# Patient Record
Sex: Male | Born: 1975
Health system: Southern US, Community
[De-identification: ages and names within clinical notes are randomized; demographics above are authoritative.]

## PROBLEM LIST (undated history)

## (undated) DIAGNOSIS — I341 Nonrheumatic mitral (valve) prolapse: Secondary | ICD-10-CM

## (undated) DIAGNOSIS — E785 Hyperlipidemia, unspecified: Secondary | ICD-10-CM

## (undated) HISTORY — DX: Hyperlipidemia, unspecified: E78.5

---

## 2007-01-25 HISTORY — PX: ANKLE FRACTURE SURGERY: SHX122

## 2012-05-10 ENCOUNTER — Emergency Department (HOSPITAL_COMMUNITY)
Admission: EM | Admit: 2012-05-10 | Discharge: 2012-05-10 | Disposition: A | Discharge status: Home / Self Care | Payer: Medicaid Other | Attending: Emergency Medicine | Admitting: Emergency Medicine

## 2012-05-10 ENCOUNTER — Encounter (HOSPITAL_COMMUNITY): Payer: Self-pay | Admitting: Emergency Medicine

## 2012-05-10 DIAGNOSIS — R300 Dysuria: Secondary | ICD-10-CM | POA: Insufficient documentation

## 2012-05-10 DIAGNOSIS — R350 Frequency of micturition: Secondary | ICD-10-CM | POA: Insufficient documentation

## 2012-05-10 DIAGNOSIS — K047 Periapical abscess without sinus: Secondary | ICD-10-CM | POA: Insufficient documentation

## 2012-05-10 DIAGNOSIS — K089 Disorder of teeth and supporting structures, unspecified: Secondary | ICD-10-CM | POA: Insufficient documentation

## 2012-05-10 DIAGNOSIS — Z8679 Personal history of other diseases of the circulatory system: Secondary | ICD-10-CM | POA: Insufficient documentation

## 2012-05-10 HISTORY — DX: Nonrheumatic mitral (valve) prolapse: I34.1

## 2012-05-10 LAB — URINE MICROSCOPIC-ADD ON

## 2012-05-10 LAB — URINALYSIS, ROUTINE W REFLEX MICROSCOPIC
Glucose, UA: NEGATIVE mg/dL
Leukocytes, UA: NEGATIVE
Protein, ur: NEGATIVE mg/dL
pH: 5.5 (ref 5.0–8.0)

## 2012-05-10 MED ORDER — PENICILLIN V POTASSIUM 500 MG PO TABS: 500.0000 mg | ORAL_TABLET | Freq: Four times a day (QID) | ORAL | Status: AC

## 2012-05-10 MED ORDER — HYDROCODONE-ACETAMINOPHEN 5-325 MG PO TABS: 2.0000 | ORAL_TABLET | ORAL | Status: DC | PRN

## 2012-05-10 NOTE — ED Provider Notes (Signed)
History     CSN: [id number]  Arrival date & time 05/10/12  1215   First MD Initiated Contact with Patient 05/10/12 1225      Chief Complaint  Patient presents with  . Dental Pain    (Consider location/radiation/quality/duration/timing/severity/associated sxs/prior treatment) HPI Comments: Patient is a 37 year old male who presents today with worsening right sided dental pain. He has previously had a root canal in this tooth. He states the pain is intermittent it is sharp and radiates up into his face. He states there is gum swelling. The pain is sharp. Eating makes the pain worse. He denies fevers, chills, nausea, vomiting, abdominal pain, trismus, weakness, numbness. He admits to urinary frequency and dysuria.  Patient is a 37 y.o. male presenting with tooth pain. The history is provided by the patient. No language interpreter was used.  Dental PainPrimary symptoms do not include headaches, fever, shortness of breath, sore throat or cough. The symptoms began 2 days ago. The symptoms are worsening. The symptoms are new. The symptoms occur intermittently.  Additional symptoms include: gum swelling, gum tenderness and purulent gums. Additional symptoms do not include: trismus, jaw pain, facial swelling, trouble swallowing, dry mouth and drooling.    Past Medical History  Diagnosis Date  . Mitral valve prolapse     History reviewed. No pertinent past surgical history.  History reviewed. No pertinent family history.  History  Substance Use Topics  . Smoking status: Never Smoker   . Smokeless tobacco: Not on file  . Alcohol Use: No      Review of Systems  Constitutional: Negative for fever and chills.  HENT: Positive for dental problem. Negative for sore throat, facial swelling, drooling and trouble swallowing.   Respiratory: Negative for cough and shortness of breath.   Neurological: Negative for headaches.  All other systems reviewed and are negative.    Allergies   Review of patient's allergies indicates no known allergies.  Home Medications  No current outpatient prescriptions on file.  BP 118/69  Pulse 66  Temp(Src) 97 F (36.1 C) (Oral)  Resp 20  SpO2 96%  Physical Exam  Nursing note and vitals reviewed. Constitutional: He is oriented to person, place, and time. He appears well-developed and well-nourished. No distress.  HENT:  Head: Normocephalic and atraumatic.  Right Ear: External ear normal.  Left Ear: External ear normal.  Nose: Nose normal.  Small 5 mm abscess visible on upper right gumline No submental edema, tongue elevation, or trismus  Eyes: Conjunctivae and lids are normal.  Neck: Normal range of motion. No tracheal deviation present.  Cardiovascular: Normal rate, regular rhythm and normal heart sounds.   Pulmonary/Chest: Effort normal and breath sounds normal. No stridor.  Abdominal: Soft. He exhibits no distension. There is no tenderness.  Musculoskeletal: Normal range of motion.  Neurological: He is alert and oriented to person, place, and time.  Skin: Skin is warm and dry. He is not diaphoretic.  Psychiatric: He has a normal mood and affect. His behavior is normal.    ED Course  Procedures (including critical care time)  Labs Reviewed  URINALYSIS, ROUTINE W REFLEX MICROSCOPIC - Abnormal; Notable for the following:    Hgb urine dipstick SMALL (*)    All other components within normal limits  URINE MICROSCOPIC-ADD ON   No results found.   1. Dental abscess       MDM  Patient presents with worsening dental pain. Visible abscess which was successfully drained without complication. No signs of impending obstruction.  Discussed salt water gargles throughout the day. Given penicillin. Referred to dentist on call. Resource guide given to encourage patient to followup with PCP.  Return instructions given. Vital signs stable for discharge. Patient / Family / Caregiver informed of clinical course, understand medical  decision-making process, and agree with plan.        Mora Bellman, PA-C 05/10/12 [phone]

## 2012-05-10 NOTE — ED Notes (Signed)
Also c/o urinary frequency and burning since last pm.

## 2012-05-10 NOTE — ED Provider Notes (Signed)
Medical screening examination/treatment/procedure(s) were performed by non-physician practitioner and as supervising physician I was immediately available for consultation/collaboration.  Raeford Razor, MD 05/10/12 [phone]

## 2012-05-10 NOTE — ED Notes (Signed)
C/O pain and swelling right upper gum. Has had "root canal" on that tooth. Pt's brother is a Education officer, community in [place]. Pt has made an appointment with a dentist for May 28th.

## 2012-05-10 NOTE — ED Notes (Signed)
Pt c/o right upper dental pain that is intermittent

## 2012-11-25 ENCOUNTER — Emergency Department (HOSPITAL_COMMUNITY)
Admission: EM | Admit: 2012-11-25 | Discharge: 2012-11-26 | Disposition: A | Discharge status: Home / Self Care | Payer: Medicaid Other | Attending: Emergency Medicine | Admitting: Emergency Medicine

## 2012-11-25 ENCOUNTER — Encounter (HOSPITAL_COMMUNITY): Payer: Self-pay | Admitting: Emergency Medicine

## 2012-11-25 ENCOUNTER — Emergency Department (HOSPITAL_COMMUNITY): Payer: Medicaid Other

## 2012-11-25 DIAGNOSIS — Y9389 Activity, other specified: Secondary | ICD-10-CM | POA: Insufficient documentation

## 2012-11-25 DIAGNOSIS — S6980XA Other specified injuries of unspecified wrist, hand and finger(s), initial encounter: Secondary | ICD-10-CM | POA: Insufficient documentation

## 2012-11-25 DIAGNOSIS — W230XXA Caught, crushed, jammed, or pinched between moving objects, initial encounter: Secondary | ICD-10-CM | POA: Insufficient documentation

## 2012-11-25 DIAGNOSIS — S60112A Contusion of left thumb with damage to nail, initial encounter: Secondary | ICD-10-CM

## 2012-11-25 DIAGNOSIS — S6000XA Contusion of unspecified finger without damage to nail, initial encounter: Secondary | ICD-10-CM | POA: Insufficient documentation

## 2012-11-25 DIAGNOSIS — S6990XA Unspecified injury of unspecified wrist, hand and finger(s), initial encounter: Secondary | ICD-10-CM | POA: Insufficient documentation

## 2012-11-25 DIAGNOSIS — M79645 Pain in left finger(s): Secondary | ICD-10-CM

## 2012-11-25 DIAGNOSIS — Y9289 Other specified places as the place of occurrence of the external cause: Secondary | ICD-10-CM | POA: Insufficient documentation

## 2012-11-25 DIAGNOSIS — Z8679 Personal history of other diseases of the circulatory system: Secondary | ICD-10-CM | POA: Insufficient documentation

## 2012-11-25 NOTE — ED Notes (Signed)
The pt is c/o pain in his lt thumb distal.  He caught it in a car door earlier tonight and there is blood collected under the nail.  The thumb is throbbing

## 2012-11-26 MED ORDER — HYDROCODONE-ACETAMINOPHEN 5-325 MG PO TABS: 1.0000 | ORAL_TABLET | Freq: Four times a day (QID) | ORAL | Status: DC | PRN

## 2012-11-26 NOTE — ED Provider Notes (Addendum)
CSN: [id number]     Arrival date & time 11/25/12  2253 History   First MD Initiated Contact with Patient 11/25/12 2331     Chief Complaint  Patient presents with  . Finger Injury   (Consider location/radiation/quality/duration/timing/severity/associated sxs/prior Treatment) HPI Comments: Patient is a 37 y/o male who presents for pain to his L distal thumb. Patient states pain is throbbing and nonradiating and has been constant since onset after getting it caught in a car door earlier. Patient has tried topical and PO pain medicine without relief of throbbing pain. Denies fever, numbness/tingling, weakness, and pallor.  The history is provided by the patient. No language interpreter was used.    Past Medical History  Diagnosis Date  . Mitral valve prolapse    History reviewed. No pertinent past surgical history. No family history on file. History  Substance Use Topics  . Smoking status: Never Smoker   . Smokeless tobacco: Not on file  . Alcohol Use: No    Review of Systems  Constitutional: Negative for fever.  Musculoskeletal: Positive for arthralgias and myalgias.  Skin: Positive for color change. Negative for pallor.  Neurological: Negative for weakness and numbness.  All other systems reviewed and are negative.    Allergies  Review of patient's allergies indicates no known allergies.  Home Medications   Current Outpatient Rx  Name  Route  Sig  Dispense  Refill  . acetaminophen (TYLENOL) 500 MG tablet   Oral   Take 1,000 mg by mouth every 6 (six) hours as needed for pain.         Marland Kitchen HYDROcodone-acetaminophen (NORCO/VICODIN) 5-325 MG per tablet   Oral   Take 1 tablet by mouth every 6 (six) hours as needed for pain.   5 tablet   0    BP 123/83  Pulse 80  Temp(Src) 97.6 F (36.4 C)  Resp 18  SpO2 98%  Physical Exam  Nursing note and vitals reviewed. Constitutional: He is oriented to person, place, and time. He appears well-developed and well-nourished. No  distress.  HENT:  Head: Normocephalic and atraumatic.  Eyes: Conjunctivae and EOM are normal. No scleral icterus.  Neck: Normal range of motion.  Cardiovascular: Normal rate, regular rhythm and intact distal pulses.   Distal radial pulse of LUE 2+. Normal capillary refill.  Pulmonary/Chest: Effort normal. No respiratory distress.  Musculoskeletal: Normal range of motion.  Normal ROM of LUE and hand. Normal grip strength and 5/5 strength against resistance of flexors and extensors of L thumb. +Subungual hematoma.  Neurological: He is alert and oriented to person, place, and time.  Skin: Skin is warm and dry. No rash noted. He is not diaphoretic. No erythema. No pallor.  Psychiatric: He has a normal mood and affect. His behavior is normal.    ED Course  Cauterization Date/Time: 11/26/2012 12:36 AM Performed by: Antony Madura Authorized by: Antony Madura Consent: Verbal consent obtained. written consent not obtained. The procedure was performed in an emergent situation. Risks and benefits: risks, benefits and alternatives were discussed Consent given by: patient Patient understanding: patient states understanding of the procedure being performed Patient consent: the patient's understanding of the procedure matches consent given Procedure consent: procedure consent matches procedure scheduled Relevant documents: relevant documents present and verified Test results: test results available and properly labeled Site marked: the operative site was marked Imaging studies: imaging studies available Required items: required blood products, implants, devices, and special equipment available Patient identity confirmed: verbally with patient and arm band Time  out: Immediately prior to procedure a "time out" was called to verify the correct patient, procedure, equipment, support staff and site/side marked as required. Preparation: Patient was prepped and draped in the usual sterile fashion. Local  anesthesia used: no Patient sedated: no Patient tolerance: Patient tolerated the procedure well with no immediate complications. Comments: Nail cauterization for subungual hematoma using a disposable nail cauterizer   (including critical care time) Labs Review Labs Reviewed - No data to display  Imaging Review Dg Finger Thumb Left  11/25/2012   CLINICAL DATA:  Injury to the left thumb.  EXAM: LEFT THUMB 2+V  COMPARISON:  No priors.  FINDINGS: Multiple views of the left from demonstrate no acute displaced fracture, subluxation, dislocation, or soft tissue abnormality. Well corticated bony fragment adjacent of the volar plate of the distal phalanx may represent sequela of remote avulsion fracture (this does not represent an acute injury).  IMPRESSION: No acute radiographic abnormality of the left thumb.   Electronically Signed   By: Trudie Reed M.D.   On: 11/25/2012 23:21    EKG Interpretation   None       MDM   1. Subungual hematoma of left thumb, initial encounter   2. Thumb pain, left    Left thumb pain secondary to subungual hematoma after getting his finger caught in a door today. No evidence of acute fracture on x-ray. Patient: Nontoxic-appearing, hemodynamically stable, and afebrile. He is neurovascularly intact with good perfusion to his left thumb. Symptoms managed in ED with nail cautery with significant improvement in pain. Patient stable and appropriate for d/c with PCP follow up to ensure proper healing. Return precautions discussed and patient agreeable to plan with no unaddressed concerns.     Antony Madura, PA-C 11/27/12 [id number]  Antony Madura, PA-C 12/04/12 2017

## 2012-11-28 NOTE — ED Provider Notes (Signed)
Medical screening examination/treatment/procedure(s) were performed by non-physician practitioner and as supervising physician I was immediately available for consultation/collaboration.  EKG Interpretation   None         William Mckennah Kretchmer, MD 11/28/12 0752 

## 2012-12-05 NOTE — ED Provider Notes (Signed)
Medical screening examination/treatment/procedure(s) were performed by non-physician practitioner and as supervising physician I was immediately available for consultation/collaboration.  EKG Interpretation   None         Dagmar Hait, MD 12/05/12 [phone]

## 2013-12-31 ENCOUNTER — Other Ambulatory Visit (HOSPITAL_COMMUNITY): Payer: Self-pay | Admitting: Internal Medicine

## 2013-12-31 DIAGNOSIS — I341 Nonrheumatic mitral (valve) prolapse: Secondary | ICD-10-CM

## 2014-01-02 ENCOUNTER — Ambulatory Visit (HOSPITAL_COMMUNITY)
Admission: RE | Admit: 2014-01-02 | Discharge: 2014-01-02 | Disposition: A | Discharge status: Home / Self Care | Payer: Medicaid Other | Source: Ambulatory Visit | Attending: Internal Medicine | Admitting: Internal Medicine

## 2014-01-02 DIAGNOSIS — I349 Nonrheumatic mitral valve disorder, unspecified: Secondary | ICD-10-CM | POA: Diagnosis present

## 2014-01-02 DIAGNOSIS — I341 Nonrheumatic mitral (valve) prolapse: Secondary | ICD-10-CM

## 2014-01-02 DIAGNOSIS — I519 Heart disease, unspecified: Secondary | ICD-10-CM

## 2014-01-02 NOTE — Progress Notes (Signed)
Echocardiogram 2D Echocardiogram has been performed.  Dorothey BasemanReel, Ninfa Giannelli M 01/02/2014, 2:14 PM

## 2015-01-03 IMAGING — CR DG FINGER THUMB 2+V*L*
3 series · 3 of 3 positions shown · non-contrast
Comparison: No priors.

CLINICAL DATA: Injury to the left thumb.

EXAM:
LEFT THUMB 2+V

[x finger pa left]
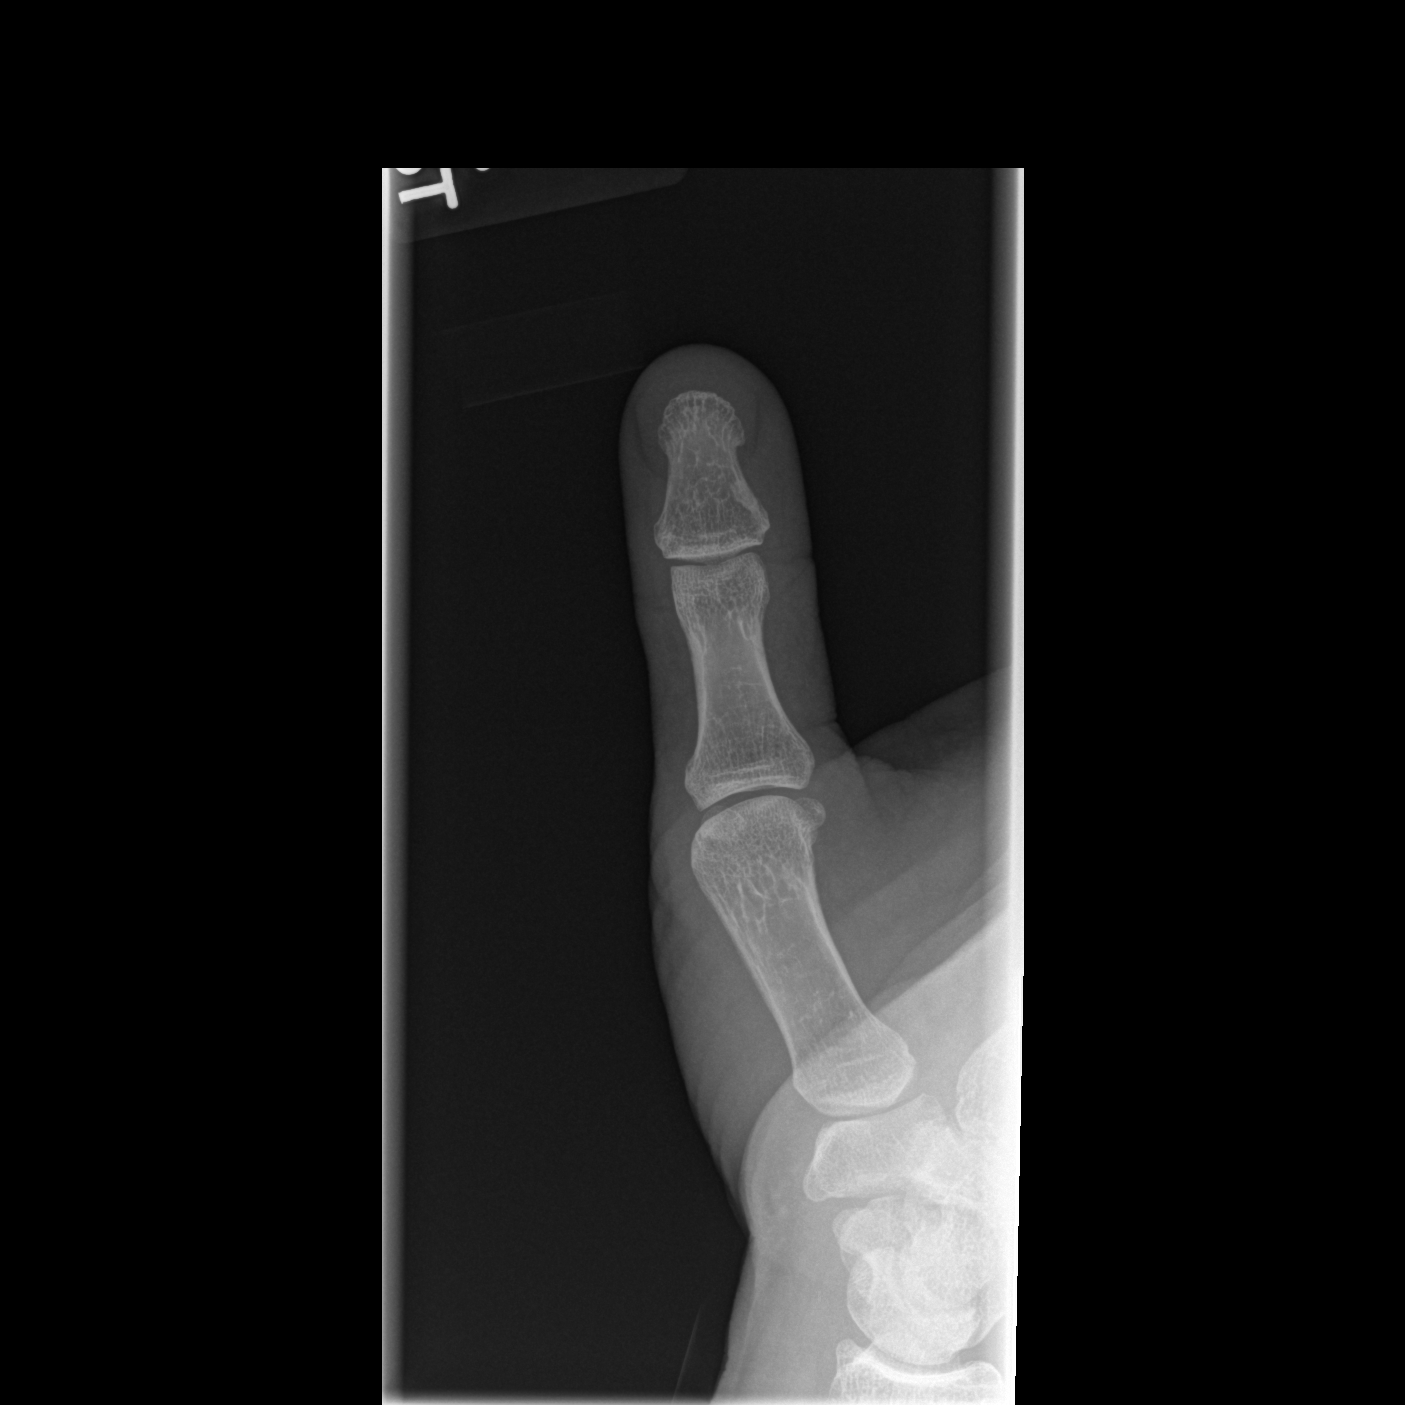

[x finger obl. left]
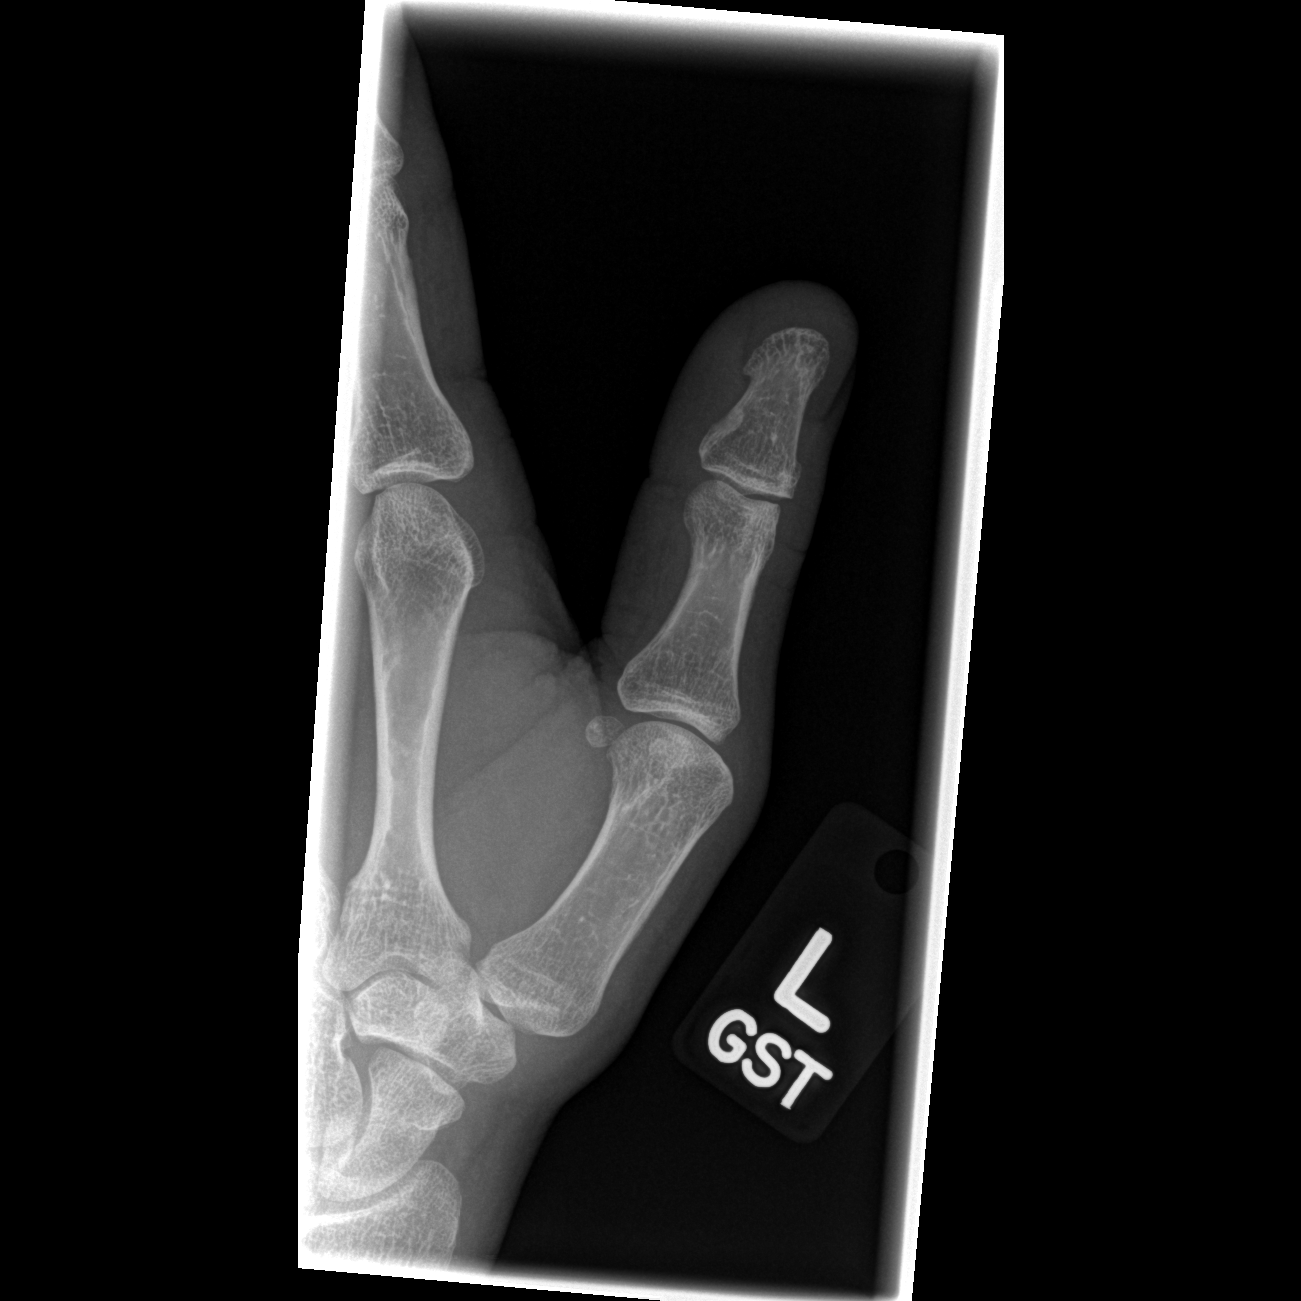

[x finger lateral left]
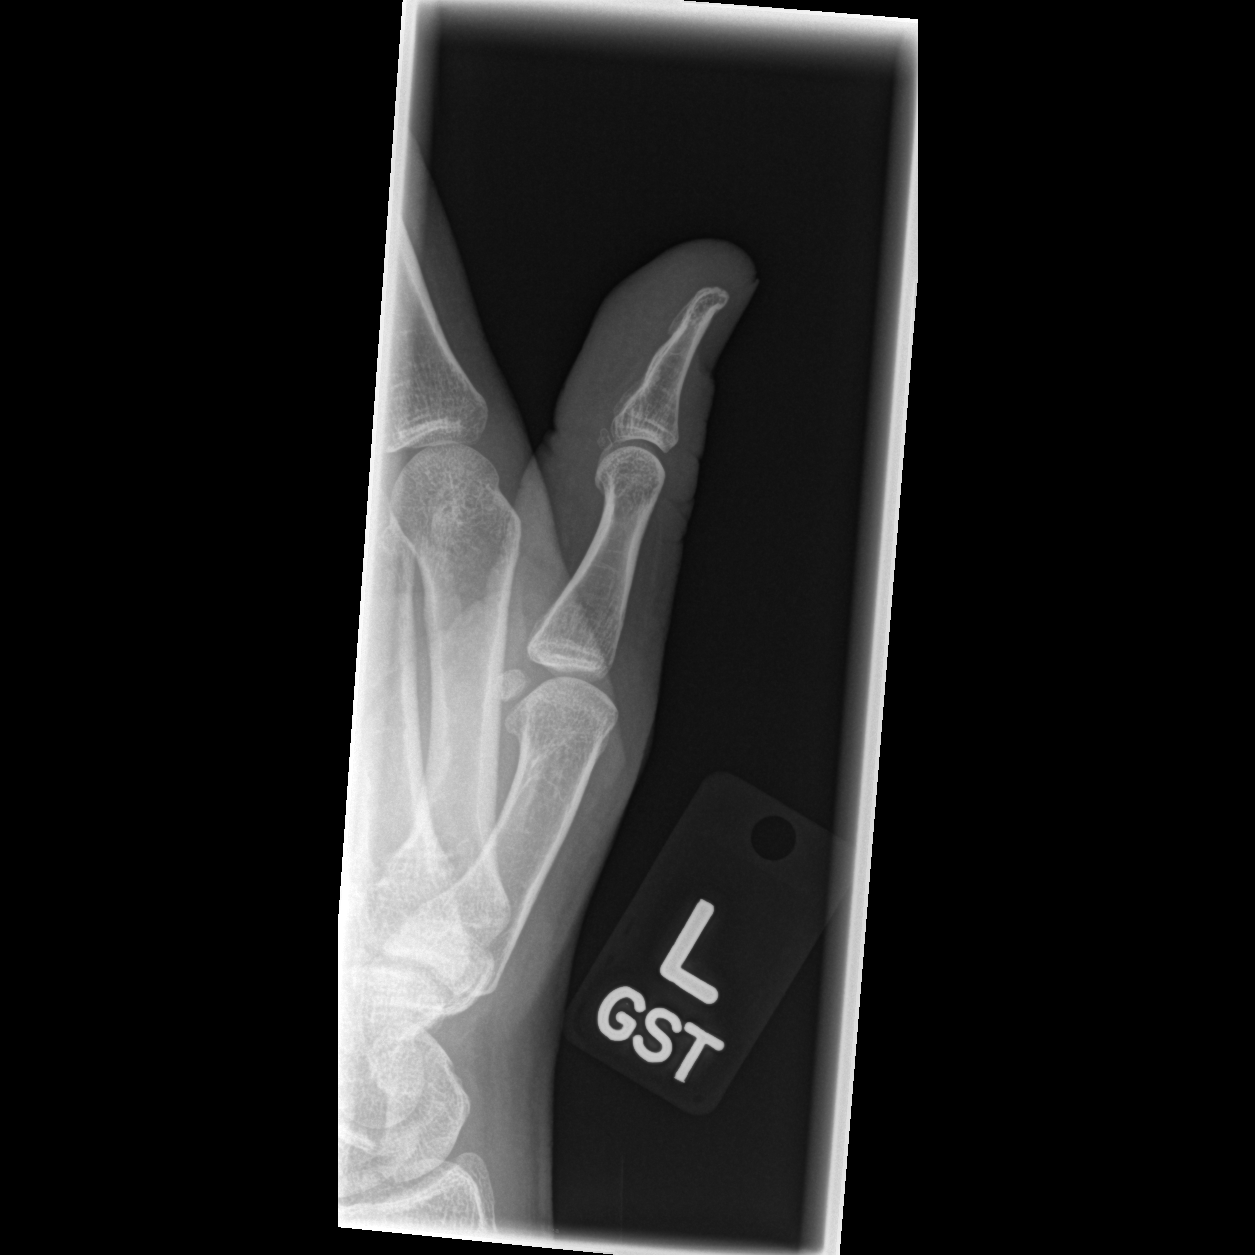

[3 of 3 positions shown; findings below may reference images not displayed]

FINDINGS: Multiple views of the left from demonstrate no acute displaced
fracture, subluxation, dislocation, or soft tissue abnormality. Well
corticated bony fragment adjacent of the volar plate of the distal
phalanx may represent sequela of remote avulsion fracture (this does
not represent an acute injury).
IMPRESSION: No acute radiographic abnormality of the left thumb.

## 2017-09-07 DIAGNOSIS — R1032 Left lower quadrant pain: Secondary | ICD-10-CM | POA: Diagnosis not present

## 2017-09-13 DIAGNOSIS — H1045 Other chronic allergic conjunctivitis: Secondary | ICD-10-CM | POA: Diagnosis not present

## 2017-09-13 DIAGNOSIS — H5203 Hypermetropia, bilateral: Secondary | ICD-10-CM | POA: Diagnosis not present

## 2017-09-13 DIAGNOSIS — H524 Presbyopia: Secondary | ICD-10-CM | POA: Diagnosis not present

## 2019-08-13 ENCOUNTER — Other Ambulatory Visit: Payer: Self-pay

## 2019-08-13 ENCOUNTER — Encounter (HOSPITAL_COMMUNITY): Payer: Self-pay | Admitting: Emergency Medicine

## 2019-08-13 ENCOUNTER — Ambulatory Visit (HOSPITAL_COMMUNITY)
Admission: EM | Admit: 2019-08-13 | Discharge: 2019-08-13 | Disposition: A | Discharge status: Home / Self Care | Payer: Medicaid Other | Attending: Family Medicine | Admitting: Family Medicine

## 2019-08-13 DIAGNOSIS — S46912A Strain of unspecified muscle, fascia and tendon at shoulder and upper arm level, left arm, initial encounter: Secondary | ICD-10-CM | POA: Diagnosis not present

## 2019-08-13 MED ORDER — PREDNISONE 10 MG PO TABS: ORAL_TABLET | ORAL | 0 refills | Status: DC

## 2019-08-13 MED ORDER — TIZANIDINE HCL 4 MG PO TABS: 4.0000 mg | ORAL_TABLET | Freq: Four times a day (QID) | ORAL | 0 refills | Status: DC | PRN

## 2019-08-13 MED FILL — predniSONE 10 MG TABS: 10 | 6 days supply | Qty: 21 | Fill #0

## 2019-08-13 MED FILL — tiZANidine HCL 4 MG TABS: 4 | 8 days supply | Qty: 30 | Fill #0

## 2019-08-13 NOTE — ED Provider Notes (Signed)
[facility]    CSN: [id number] Arrival date & time: 08/13/19  0920      History   Chief Complaint Chief Complaint  Patient presents with  . Shoulder Pain    HPI Drew Roberson is a 44 y.o. male presenting today for evaluation of left shoulder pain.  Patient reports over the past 6 days he has had pain that begins in his upper back and radiates into his left arm.  Pain is migratory, sometimes feels most prominent at shoulder, sometimes within bicep and sometimes in forearm.  He denies associated paresthesias, numbness or tingling.  Denies difficulty moving shoulder or any weakness.  He denies any specific injury, but does recall symptoms started after he had gone to a water park is unsure if he could have hit his shoulder on the side of the slide.  No immediate pain.  Has had some prior issues with shoulder related to heavy lifting.  Using NSAIDs without relief.  HPI  Past Medical History:  Diagnosis Date  . Mitral valve prolapse     There are no problems to display for this patient.   History reviewed. No pertinent surgical history.     Home Medications    Prior to Admission medications   Medication Sig Start Date End Date Taking? Authorizing Provider  acetaminophen (TYLENOL) 500 MG tablet Take 1,000 mg by mouth every 6 (six) hours as needed for pain.    [provider]  HYDROcodone-acetaminophen (NORCO/VICODIN) 5-325 MG per tablet Take 1 tablet by mouth every 6 (six) hours as needed for pain. 11/26/12   Antony Madura, PA-C  predniSONE (DELTASONE) 10 MG tablet Begin with 6 tabs on day 1, 5 tab on day 2, 4 tab on day 3, 3 tab on day 4, 2 tab on day 5, 1 tab on day 6-take with food 08/13/19   Nadya Hopwood C, PA-C  tiZANidine (ZANAFLEX) 4 MG tablet Take 1 tablet (4 mg total) by mouth every 6 (six) hours as needed for muscle spasms. 08/13/19   Toluwanimi Radebaugh, Junius Creamer, PA-C    Family History No family history on file.  Social History Social History   Tobacco  Use  . Smoking status: Never Smoker  . Smokeless tobacco: Never Used  Substance Use Topics  . Alcohol use: No  . Drug use: No     Allergies   Patient has no known allergies.   Review of Systems Review of Systems  Constitutional: Negative for activity change, chills, diaphoresis and fatigue.  HENT: Negative for ear pain, tinnitus and trouble swallowing.   Eyes: Negative for photophobia and visual disturbance.  Respiratory: Negative for cough, chest tightness and shortness of breath.   Cardiovascular: Negative for chest pain and leg swelling.  Gastrointestinal: Negative for abdominal pain, blood in stool, nausea and vomiting.  Musculoskeletal: Positive for arthralgias and myalgias. Negative for back pain, gait problem, neck pain and neck stiffness.  Skin: Negative for color change and wound.  Neurological: Negative for dizziness, weakness, light-headedness, numbness and headaches.     Physical Exam Triage Vital Signs ED Triage Vitals  Enc Vitals Group     BP 08/13/19 0935 121/87     Pulse Rate 08/13/19 0935 90     Resp 08/13/19 0935 18     Temp 08/13/19 0935 97.7 F (36.5 C)     Temp Source 08/13/19 0935 Oral     SpO2 08/13/19 0935 97 %     Weight --      Height --  Head Circumference --      Peak Flow --      Pain Score 08/13/19 0933 7     Pain Loc --      Pain Edu? --      Excl. in GC? --    No data found.  Updated Vital Signs BP 121/87   Pulse 90   Temp 97.7 F (36.5 C) (Oral)   Resp 18   SpO2 97%   Visual Acuity Right Eye Distance:   Left Eye Distance:   Bilateral Distance:    Right Eye Near:   Left Eye Near:    Bilateral Near:     Physical Exam Vitals and nursing note reviewed.  Constitutional:      Appearance: He is well-developed.     Comments: No acute distress  HENT:     Head: Normocephalic and atraumatic.     Nose: Nose normal.  Eyes:     Conjunctiva/sclera: Conjunctivae normal.  Cardiovascular:     Rate and Rhythm: Normal rate.   Pulmonary:     Effort: Pulmonary effort is normal. No respiratory distress.  Abdominal:     General: There is no distension.  Musculoskeletal:        General: Normal range of motion.     Cervical back: Neck supple.     Comments: Nontender to palpation of cervical, thoracic and lumbar spine midline, tenderness to palpation to superior thoracic musculature on left side, nontender to palpation of proximal upper arm, tender to palpation of distal anterior bicep, mild tenderness throughout forearm Full active range of motion at shoulder elbow and wrist, strength 5/5 ankle bilaterally at shoulder elbow and wrist Radial pulse 2+  Skin:    General: Skin is warm and dry.  Neurological:     Mental Status: He is alert and oriented to person, place, and time.      UC Treatments / Results  Labs (all labs ordered are listed, but only abnormal results are displayed) Labs Reviewed - No data to display  EKG   Radiology No results found.  Procedures Procedures (including critical care time)  Medications Ordered in UC Medications - No data to display  Initial Impression / Assessment and Plan / UC Course  I have reviewed the triage vital signs and the nursing notes.  Pertinent labs & imaging results that were available during my care of the patient were reviewed by me and considered in my medical decision making (see chart for details).     Suspect likely muscular etiology, no specific mechanism of injury, full active range of motion of extremity, do not suspect acute bony abnormality.  No relief with NSAIDs, will provide prednisone taper x6 days along with muscle relaxers, continue ice and heat and gentle stretching.  Discussed strict return precautions. Patient verbalized understanding and is agreeable with plan.  Final Clinical Impressions(s) / UC Diagnoses   Final diagnoses:  Strain of left shoulder, initial encounter     Discharge Instructions     Begin prednisone taper over  the next 6 days-begin with 6 tabs/60 mg on day 1, decrease by 1 tab until complete-6, 5, 4, 3, 2, 1-take with food in the morning if you are able You may use tizanidine as needed to help with pain. This is a muscle relaxer and causes sedation- please use only at bedtime or when you will be home and not have to drive/work Alternate ice and heat Gentle stretching Follow-up if not improving or worsening    ED  Prescriptions    Medication Sig Dispense Auth. Provider   predniSONE (DELTASONE) 10 MG tablet Begin with 6 tabs on day 1, 5 tab on day 2, 4 tab on day 3, 3 tab on day 4, 2 tab on day 5, 1 tab on day 6-take with food 21 tablet Leotha Westermeyer C, PA-C   tiZANidine (ZANAFLEX) 4 MG tablet Take 1 tablet (4 mg total) by mouth every 6 (six) hours as needed for muscle spasms. 30 tablet Jaonna Word, [place] C, PA-C     PDMP not reviewed this encounter.   Judit Awad, [place] C, PA-C 08/13/19 1018

## 2019-08-13 NOTE — Discharge Instructions (Signed)
Begin prednisone taper over the next 6 days-begin with 6 tabs/60 mg on day 1, decrease by 1 tab until complete-6, 5, 4, 3, 2, 1-take with food in the morning if you are able You may use tizanidine as needed to help with pain. This is a muscle relaxer and causes sedation- please use only at bedtime or when you will be home and not have to drive/work Alternate ice and heat Gentle stretching Follow-up if not improving or worsening

## 2019-08-13 NOTE — ED Triage Notes (Addendum)
Right shoulder pain for 6 days. Started after visiting water park, which he does frequently. Feels that it is nerve related, pain shoots down left arm. Effects various parts of arm.  Denies chest pain / shortness of breath

## 2019-08-14 ENCOUNTER — Ambulatory Visit (INDEPENDENT_AMBULATORY_CARE_PROVIDER_SITE_OTHER): Payer: Medicaid Other | Admitting: Family Medicine

## 2019-08-14 ENCOUNTER — Encounter: Payer: Self-pay | Admitting: Family Medicine

## 2019-08-14 VITALS — BP 110/64 | HR 87 | Ht 67.0 in | Wt 174.0 lb

## 2019-08-14 DIAGNOSIS — Z1159 Encounter for screening for other viral diseases: Secondary | ICD-10-CM

## 2019-08-14 DIAGNOSIS — Z9189 Other specified personal risk factors, not elsewhere classified: Secondary | ICD-10-CM | POA: Diagnosis not present

## 2019-08-14 DIAGNOSIS — R17 Unspecified jaundice: Secondary | ICD-10-CM | POA: Diagnosis not present

## 2019-08-14 DIAGNOSIS — Z114 Encounter for screening for human immunodeficiency virus [HIV]: Secondary | ICD-10-CM | POA: Diagnosis not present

## 2019-08-14 DIAGNOSIS — Z13220 Encounter for screening for lipoid disorders: Secondary | ICD-10-CM | POA: Diagnosis not present

## 2019-08-14 DIAGNOSIS — Z131 Encounter for screening for diabetes mellitus: Secondary | ICD-10-CM | POA: Diagnosis not present

## 2019-08-14 NOTE — Assessment & Plan Note (Signed)
Follow-up lipid panel 

## 2019-08-14 NOTE — Assessment & Plan Note (Signed)
He specifically asked to be screened for hepatitis B due to his wife's history.  We discussed direct antigen/antibody testing versus CMP.  Through shared decision making we decided to start with a CMP to assess his liver enzymes and to move forward with antigen/antibody testing if there are any liver abnormalities. -Follow-up CMP, HIV, hep C

## 2019-08-14 NOTE — Progress Notes (Addendum)
    SUBJECTIVE:   CHIEF COMPLAINT / HPI:   Drew Roberson presented to clinic today for new patient encounter.  The majority of his medical history was filled out through the history tab.  His additional concerns for today included:  Family history of diabetes He is previously been screened not found to have diabetes but he is very interested in a screening test today for diabetes.  He noted several times that he makes an effort to stay active and maintain a healthy diet.  Hypercholesterolemia He notes multiple people in his family have elevated cholesterol and heart disease.  He has previously been tested for cholesterol and found to have elevated levels.  He wants to be sure that he is tested today.  He is currently fasting for this test.  Screening for hepatitis B He reports that his wife was diagnosed with hepatitis B in 2014 and has had a chronic infection since then.  His wife is currently taking tenofovir to help suppress the hepatitis B.  He is interested in screening for hepatitis B to ensure he has not been infected.  He has previously received a vaccine for hepatitis B.  Per chart review, there is not evidence of previous testing for antibodies or antigens.  PERTINENT  PMH / PSH: Mitral valve prolapse, hyperlipidemia  OBJECTIVE:   BP 110/64   Pulse 87   Ht 5\' 7"  (1.702 m)   Wt 174 lb (78.9 kg)   SpO2 97%   BMI 27.25 kg/m    General: Alert and cooperative and appears to be in no acute distress.  Very friendly and talkative. HEENT: Moist mucous membranes.  Clear oropharynx.  Neck non-tender without lymphadenopathy, masses or thyromegaly Cardio: Normal S1 and S2, no S3 or S4. Rhythm is regular. No murmurs or rubs.   Pulm: Clear to auscultation bilaterally, no crackles, wheezing, or diminished breath sounds. Normal respiratory effort Abdomen: Bowel sounds normal. Abdomen soft and non-tender.  Extremities: No peripheral edema. Warm/ well perfused.  Strong radial  pulse.   ASSESSMENT/PLAN:   Screening for diabetes mellitus -Follow-up A1c  Screening for hypercholesterolemia -Follow-up lipid panel  At risk for hepatitis He specifically asked to be screened for hepatitis B due to his wife's history.  We discussed direct antigen/antibody testing versus CMP.  Through shared decision making we decided to start with a CMP to assess his liver enzymes and to move forward with antigen/antibody testing if there are any liver abnormalities. -Follow-up CMP, HIV, hep C   Covid vaccine was offered to this patient but he declined.  Information was provided.  , MD [facility]

## 2019-08-14 NOTE — Assessment & Plan Note (Signed)
Follow-up A1c

## 2019-08-14 NOTE — Patient Instructions (Signed)
It was nice to see you this morning.  Hears a quick summary of the things we talked about:  High cholesterol: I will let you know if there are any abnormalities in your blood work.  Screening for diabetes: We will get a blood test to screen for diabetes today.  Hepatitis B: I do concerned about potentially having hepatitis B.  Today, we will start by taking a look at your liver enzymes.  If those are abnormal, we can move forward with specific testing for hepatitis B.  I think it is reasonable to proceed in this order based on your current lack of symptoms.  I will let you know if there are any abnormalities in your lab work.

## 2019-08-15 ENCOUNTER — Other Ambulatory Visit: Payer: Self-pay | Admitting: Family Medicine

## 2019-08-15 LAB — HEMOGLOBIN A1C
Est. average glucose Bld gHb Est-mCnc: 91 mg/dL
Hgb A1c MFr Bld: 4.8 % (ref 4.8–5.6)

## 2019-08-15 LAB — COMPREHENSIVE METABOLIC PANEL
ALT: 19 IU/L (ref 0–44)
AST: 18 IU/L (ref 0–40)
Albumin/Globulin Ratio: 1.8 (ref 1.2–2.2)
Albumin: 4.8 g/dL (ref 4.0–5.0)
Alkaline Phosphatase: 98 IU/L (ref 48–121)
BUN/Creatinine Ratio: 14 (ref 9–20)
BUN: 12 mg/dL (ref 6–24)
Bilirubin Total: 0.6 mg/dL (ref 0.0–1.2)
CO2: 20 mmol/L (ref 20–29)
Calcium: 9.6 mg/dL (ref 8.7–10.2)
Chloride: 102 mmol/L (ref 96–106)
Creatinine, Ser: 0.88 mg/dL (ref 0.76–1.27)
GFR calc Af Amer: 122 mL/min/{1.73_m2} (ref >59)
GFR calc non Af Amer: 106 mL/min/{1.73_m2} (ref >59)
Globulin, Total: 2.6 g/dL (ref 1.5–4.5)
Glucose: 107 mg/dL — ABNORMAL HIGH (ref 65–99)
Potassium: 4 mmol/L (ref 3.5–5.2)
Sodium: 139 mmol/L (ref 134–144)
Total Protein: 7.4 g/dL (ref 6.0–8.5)

## 2019-08-15 LAB — LIPID PANEL
Chol/HDL Ratio: 5.4 ratio — ABNORMAL HIGH (ref 0.0–5.0)
Cholesterol, Total: 210 mg/dL — ABNORMAL HIGH (ref 100–199)
HDL: 39 mg/dL — ABNORMAL LOW (ref >39)
LDL Chol Calc (NIH): 148 mg/dL — ABNORMAL HIGH (ref 0–99)
Triglycerides: 125 mg/dL (ref 0–149)
VLDL Cholesterol Cal: 23 mg/dL (ref 5–40)

## 2019-08-15 LAB — HEPATITIS C ANTIBODY: Hep C Virus Ab: <0.1 s/co ratio (ref 0.0–0.9)

## 2019-08-15 LAB — HIV ANTIBODY (ROUTINE TESTING W REFLEX): HIV Screen 4th Generation wRfx: NONREACTIVE

## 2019-08-15 NOTE — Progress Notes (Signed)
Your blood work has all come back healthy and normal.  There is no indication of liver issues so I do not think we need to move forward with additional testing for hepatitis at this time.  Your cholesterol is a little elevated but does not require medication at this time.  Your need for medication is determined with a risk calculator which I will include here:  The 10-year ASCVD risk score Denman George DC Montez Hageman., et al., 2013) is: 1.7%   Values used to calculate the score:     Age: 44 years     Sex: Male     Is Non-Hispanic African American: No     Diabetic: No     Tobacco smoker: No     Systolic Blood Pressure: 110 mmHg     Is BP treated: No     HDL Cholesterol: 39 mg/dL     Total Cholesterol: 210 mg/dL  Your 17-OHYW risk of having a heart issue from your elevated cholesterol is very low at 1.7%.  If you have questions about any of these tests, please do not hesitate to call the clinic. Mirian Mo, MD

## 2019-10-14 ENCOUNTER — Other Ambulatory Visit: Payer: Self-pay

## 2019-10-14 ENCOUNTER — Ambulatory Visit (HOSPITAL_COMMUNITY)
Admission: EM | Admit: 2019-10-14 | Discharge: 2019-10-14 | Disposition: A | Discharge status: Home / Self Care | Payer: Medicaid Other | Attending: Physician Assistant | Admitting: Physician Assistant

## 2019-10-14 ENCOUNTER — Encounter (HOSPITAL_COMMUNITY): Payer: Self-pay | Admitting: Emergency Medicine

## 2019-10-14 ENCOUNTER — Telehealth: Payer: Self-pay | Admitting: Physician Assistant

## 2019-10-14 DIAGNOSIS — Z20822 Contact with and (suspected) exposure to covid-19: Secondary | ICD-10-CM | POA: Insufficient documentation

## 2019-10-14 DIAGNOSIS — J069 Acute upper respiratory infection, unspecified: Secondary | ICD-10-CM | POA: Diagnosis not present

## 2019-10-14 MED ORDER — ACETAMINOPHEN 325 MG PO TABS: 650.0000 mg | ORAL_TABLET | Freq: Four times a day (QID) | ORAL | 0 refills | Status: AC | PRN

## 2019-10-14 MED ORDER — FLUTICASONE PROPIONATE 50 MCG/ACT NA SUSP: 1.0000 | Freq: Every day | NASAL | 2 refills | Status: DC

## 2019-10-14 MED ORDER — CEPACOL SORE THROAT 5.4 MG MT LOZG: 1.0000 | LOZENGE | OROMUCOSAL | 0 refills | Status: DC | PRN

## 2019-10-14 MED ORDER — BENZONATATE 100 MG PO CAPS: 100.0000 mg | ORAL_CAPSULE | Freq: Three times a day (TID) | ORAL | 0 refills | Status: DC

## 2019-10-14 MED FILL — FLUTICASONE PROP 50 MCG SPR: 50 | 30 days supply | Qty: 16 | Fill #0

## 2019-10-14 MED FILL — BENZONATATE 100 MG CAPS: 100 | 3 days supply | Qty: 21 | Fill #0

## 2019-10-14 NOTE — Telephone Encounter (Signed)
  Called to discuss with patient about Covid symptoms and the use of casirivimab/imdevimab, a monoclonal antibody infusion for those with mild to moderate Covid symptoms and at a high risk of hospitalization.    Message left to call back our hotline [phone] and sent my chart message.   Seems pending COVID test result.   Manson Passey, PA - C

## 2019-10-14 NOTE — ED Provider Notes (Signed)
[facility]    CSN: [id number] Arrival date & time: 10/14/19  0803      History   Chief Complaint Chief Complaint  Patient presents with  . Cough  . Fever    HPI Drew Roberson is a 44 y.o. male.   Patient reports for Covid symptoms after having known Covid person in his home.  His son tested positive for Covid a few days ago.  Patient state he developed mild symptoms early last week, however 3 days ago developed a bad cough, body ache and on and off fevers.  Reports cough has been dry.  Endorses runny nose and some sore throat.  Has not taken his temperature's but felt warm.  Endorses severe body aches 3 days ago that have improved significantly.  He has taken Tylenol for that.  Endorses some shortness of breath at times.  Denies chest pain.  Denies abdominal pain, nausea vomiting or diarrhea.  No change in taste or smell.  Did not receive Covid vaccinations.  He has been eating and drinking well.  No change in urine or bowel movements     Past Medical History:  Diagnosis Date  . Hyperlipidemia   . Mitral valve prolapse     Patient Active Problem List   Diagnosis Date Noted  . Screening for diabetes mellitus 08/14/2019  . Screening for hypercholesterolemia 08/14/2019  . At risk for hepatitis 08/14/2019    Past Surgical History:  Procedure Laterality Date  . ANKLE FRACTURE SURGERY Left 2009       Home Medications    Prior to Admission medications   Medication Sig Start Date End Date Taking? Authorizing Provider  acetaminophen (TYLENOL) 325 MG tablet Take 2 tablets (650 mg total) by mouth every 6 (six) hours as needed. 10/14/19   Oumou Smead, Veryl Speak, PA-C  benzonatate (TESSALON) 100 MG capsule Take 1-2 capsules (100-200 mg total) by mouth every 8 (eight) hours. 10/14/19   Brendan Gruwell, Veryl Speak, PA-C  fluticasone (FLONASE) 50 MCG/ACT nasal spray Place 1 spray into both nostrils daily. 10/14/19   Toia Micale, Veryl Speak, PA-C  Menthol (CEPACOL SORE THROAT) 5.4 MG LOZG Use as directed  1 lozenge (5.4 mg total) in the mouth or throat every 2 (two) hours as needed. 10/14/19   Morrell Fluke, Veryl Speak, PA-C  predniSONE (DELTASONE) 10 MG tablet Begin with 6 tabs on day 1, 5 tab on day 2, 4 tab on day 3, 3 tab on day 4, 2 tab on day 5, 1 tab on day 6-take with food 08/13/19   Wieters, Hallie C, PA-C  tiZANidine (ZANAFLEX) 4 MG tablet Take 1 tablet (4 mg total) by mouth every 6 (six) hours as needed for muscle spasms. 08/13/19   Wieters, Junius Creamer, PA-C    Family History Family History  Problem Relation Age of Onset  . Hypertension Mother   . Heart disease Mother   . Kidney disease Mother   . Diabetes Father   . Hyperlipidemia Father   . Hypertension Father   . Heart disease Father   . Kidney disease Sister   . Cancer Paternal Uncle   . Heart disease Maternal Grandfather   . Cancer Paternal Grandfather     Social History Social History   Tobacco Use  . Smoking status: Never Smoker  . Smokeless tobacco: Never Used  Substance Use Topics  . Alcohol use: No  . Drug use: No     Allergies   Patient has no known allergies.   Review of Systems  Review of Systems   Physical Exam Triage Vital Signs ED Triage Vitals  Enc Vitals Group     BP      Pulse      Resp      Temp      Temp src      SpO2      Weight      Height      Head Circumference      Peak Flow      Pain Score      Pain Loc      Pain Edu?      Excl. in GC?    No data found.  Updated Vital Signs BP 109/68 (BP Location: Right Arm)   Pulse 78   Temp 98.8 F (37.1 C) (Oral)   Resp 16   SpO2 100%   Visual Acuity Right Eye Distance:   Left Eye Distance:   Bilateral Distance:    Right Eye Near:   Left Eye Near:    Bilateral Near:     Physical Exam Vitals and nursing note reviewed.  Constitutional:      General: He is not in acute distress.    Appearance: Normal appearance. He is well-developed. He is not ill-appearing or diaphoretic.  HENT:     Head: Normocephalic and atraumatic.     Right  Ear: Tympanic membrane normal.     Nose: Congestion present.     Mouth/Throat:     Mouth: Mucous membranes are moist.     Pharynx: Oropharynx is clear.  Eyes:     Extraocular Movements: Extraocular movements intact.     Conjunctiva/sclera: Conjunctivae normal.     Pupils: Pupils are equal, round, and reactive to light.  Cardiovascular:     Rate and Rhythm: Normal rate and regular rhythm.     Heart sounds: No murmur heard.   Pulmonary:     Effort: Pulmonary effort is normal. No respiratory distress.     Breath sounds: Normal breath sounds. No wheezing, rhonchi or rales.     Comments: Speaking in full sentences.  No accessory muscle use.  Moving air well through all fields.  Saturating 100% on room air. Chest:     Chest wall: No tenderness.  Abdominal:     Palpations: Abdomen is soft.     Tenderness: There is no abdominal tenderness.  Musculoskeletal:     Cervical back: Normal range of motion and neck supple.     Right lower leg: No edema.     Left lower leg: No edema.  Lymphadenopathy:     Cervical: No cervical adenopathy.  Skin:    General: Skin is warm and dry.     Findings: No rash.  Neurological:     General: No focal deficit present.     Mental Status: He is alert and oriented to person, place, and time.      UC Treatments / Results  Labs (all labs ordered are listed, but only abnormal results are displayed) Labs Reviewed  SARS CORONAVIRUS 2 (TAT 6-24 HRS)    EKG   Radiology No results found.  Procedures Procedures (including critical care time)  Medications Ordered in UC Medications - No data to display  Initial Impression / Assessment and Plan / UC Course  I have reviewed the triage vital signs and the nursing notes.  Pertinent labs & imaging results that were available during my care of the patient were reviewed by me and considered in my medical decision making (see  chart for details).     #viral URI with cough #Exposure to Covid Patient is a  44 year old presenting with Covid symptoms after close exposure to Covid home.  He has normal vital signs, saturating well and is afebrile.  Normal lung exam.  Covid test sent.  Will treat symptomatically.  Discussed return, follow-up and emergency department precautions.  Discussed that regardless of his test outcome given his son at home is Covid that he should isolate for the 10 days.  Patient verbalized agreement understanding plan of care Final Clinical Impressions(s) / UC Diagnoses   Final diagnoses:  Viral URI with cough  Close exposure to COVID-19 virus     Discharge Instructions     take all medications as prescribed -Tessalon for cough -Tylenol as needed for body ache and fever -Cepacol lozenges as needed for sore throat -Flonase for nasal congestion   Monitor your symptoms, if severe symptoms of shortness of breath, severe chest pain, high fevers or other concerning symptoms go to the emergency department   While we are testing you, if you were to have a negative test given you have a close exposure with a known Covid patient in your home, you should isolate regardless for 10 days from symptom onset, have improvement in all of your symptoms and be afebrile/without fever for greater than 24 hours at the end of your 10-day isolation.  If your Covid-19 test is positive, you will receive a phone call from [facility] regarding your results. Negative test results are not called. Both positive and negative results area always visible on MyChart. If you do not have a MyChart account, sign up instructions are in your discharge papers.   Persons who are directed to care for themselves at home may discontinue isolation under the following conditions:  . At least 10 days have passed since symptom onset and . At least 24 hours have passed without running a fever (this means without the use of fever-reducing medications) and . Other symptoms have improved.  Persons infected with  COVID-19 who never develop symptoms may discontinue isolation and other precautions 10 days after the date of their first positive COVID-19 test.       ED Prescriptions    Medication Sig Dispense Auth. Provider   benzonatate (TESSALON) 100 MG capsule Take 1-2 capsules (100-200 mg total) by mouth every 8 (eight) hours. 21 capsule Landri Dorsainvil, Veryl Speak, PA-C   Menthol (CEPACOL SORE THROAT) 5.4 MG LOZG Use as directed 1 lozenge (5.4 mg total) in the mouth or throat every 2 (two) hours as needed. 30 lozenge Alida Greiner, Veryl Speak, PA-C   acetaminophen (TYLENOL) 325 MG tablet Take 2 tablets (650 mg total) by mouth every 6 (six) hours as needed. 30 tablet Shakaria Raphael, Veryl Speak, PA-C   fluticasone (FLONASE) 50 MCG/ACT nasal spray Place 1 spray into both nostrils daily. 15.8 mL Karlo Goeden, Veryl Speak, PA-C     PDMP not reviewed this encounter.   Hermelinda Medicus, PA-C 10/14/19 [id number]

## 2019-10-14 NOTE — ED Triage Notes (Signed)
Pt presents with fever, cough, body aches, SOB, chest pain xs 3 days.

## 2019-10-14 NOTE — Discharge Instructions (Signed)
take all medications as prescribed -Tessalon for cough -Tylenol as needed for body ache and fever -Cepacol lozenges as needed for sore throat -Flonase for nasal congestion   Monitor your symptoms, if severe symptoms of shortness of breath, severe chest pain, high fevers or other concerning symptoms go to the emergency department   While we are testing you, if you were to have a negative test given you have a close exposure with a known Covid patient in your home, you should isolate regardless for 10 days from symptom onset, have improvement in all of your symptoms and be afebrile/without fever for greater than 24 hours at the end of your 10-day isolation.  If your Covid-19 test is positive, you will receive a phone call from [facility] regarding your results. Negative test results are not called. Both positive and negative results area always visible on MyChart. If you do not have a MyChart account, sign up instructions are in your discharge papers.   Persons who are directed to care for themselves at home may discontinue isolation under the following conditions:   At least 10 days have passed since symptom onset and  At least 24 hours have passed without running a fever (this means without the use of fever-reducing medications) and  Other symptoms have improved.  Persons infected with COVID-19 who never develop symptoms may discontinue isolation and other precautions 10 days after the date of their first positive COVID-19 test.

## 2019-10-15 LAB — SARS CORONAVIRUS 2 (TAT 6-24 HRS): SARS Coronavirus 2: POSITIVE — AB

## 2019-10-16 ENCOUNTER — Encounter (HOSPITAL_COMMUNITY): Payer: Self-pay

## 2019-10-16 ENCOUNTER — Other Ambulatory Visit: Payer: Self-pay

## 2019-10-16 ENCOUNTER — Ambulatory Visit (HOSPITAL_COMMUNITY)
Admission: EM | Admit: 2019-10-16 | Discharge: 2019-10-16 | Disposition: A | Discharge status: Home / Self Care | Payer: Medicaid Other | Attending: Family Medicine | Admitting: Family Medicine

## 2019-10-16 ENCOUNTER — Telehealth: Payer: Self-pay | Admitting: Infectious Diseases

## 2019-10-16 DIAGNOSIS — U071 COVID-19: Secondary | ICD-10-CM

## 2019-10-16 DIAGNOSIS — H9201 Otalgia, right ear: Secondary | ICD-10-CM | POA: Diagnosis not present

## 2019-10-16 DIAGNOSIS — J029 Acute pharyngitis, unspecified: Secondary | ICD-10-CM | POA: Diagnosis not present

## 2019-10-16 MED ORDER — CIPROFLOXACIN-DEXAMETHASONE 0.3-0.1 % OT SUSP: 4.0000 [drp] | Freq: Two times a day (BID) | OTIC | 0 refills | Status: DC

## 2019-10-16 MED ORDER — ONDANSETRON 8 MG PO TBDP: 8.0000 mg | ORAL_TABLET | Freq: Three times a day (TID) | ORAL | 0 refills | Status: DC | PRN

## 2019-10-16 MED ORDER — PROMETHAZINE-DM 6.25-15 MG/5ML PO SYRP: 5.0000 mL | ORAL_SOLUTION | Freq: Four times a day (QID) | ORAL | 0 refills | Status: DC | PRN

## 2019-10-16 MED ORDER — LIDOCAINE VISCOUS HCL 2 % MT SOLN: 20.0000 mL | OROMUCOSAL | 0 refills | Status: DC | PRN

## 2019-10-16 MED ORDER — IBUPROFEN 800 MG PO TABS: 800.0000 mg | ORAL_TABLET | Freq: Three times a day (TID) | ORAL | 0 refills | Status: DC

## 2019-10-16 MED FILL — ONDANSETRON ODT 8 MG TABLET: 8 | 7 days supply | Qty: 21 | Fill #0

## 2019-10-16 MED FILL — IBUPROFEN 800 MG TAB: 800 | 7 days supply | Qty: 21 | Fill #0

## 2019-10-16 MED FILL — LIDOCAINE VISCOUS HCL 2 % S: 2 | 5 days supply | Qty: 100 | Fill #0

## 2019-10-16 MED FILL — PROMETHAZINE W/DM SYRUP: 6.25-15 | 5 days supply | Qty: 100 | Fill #0

## 2019-10-16 NOTE — ED Provider Notes (Signed)
[facility]    CSN: [id number] Arrival date & time: 10/16/19  0801      History   Chief Complaint Chief Complaint  Patient presents with  . Covid +    HPI Drew Roberson is a 44 y.o. male.   Here today with his wife with newly diagnosed COVID 19 infection and ongoing sore throat, right ear pain, nausea and cough. States the ear pain has been ongoing all summer from swimming in pools. Denies fever, chills, abdominal pain, vomiting, diarrhea. Has been trying OTC fever reducers and tessalon perles without relief. No known hx of asthma or immune compromise.      Past Medical History:  Diagnosis Date  . Hyperlipidemia   . Mitral valve prolapse     Patient Active Problem List   Diagnosis Date Noted  . Screening for diabetes mellitus 08/14/2019  . Screening for hypercholesterolemia 08/14/2019  . At risk for hepatitis 08/14/2019    Past Surgical History:  Procedure Laterality Date  . ANKLE FRACTURE SURGERY Left 2009       Home Medications    Prior to Admission medications   Medication Sig Start Date End Date Taking? Authorizing Provider  acetaminophen (TYLENOL) 325 MG tablet Take 2 tablets (650 mg total) by mouth every 6 (six) hours as needed. 10/14/19   Darr, Veryl Speak, PA-C  benzonatate (TESSALON) 100 MG capsule Take 1-2 capsules (100-200 mg total) by mouth every 8 (eight) hours. 10/14/19   Darr, Veryl Speak, PA-C  ciprofloxacin-dexamethasone (CIPRODEX) OTIC suspension Place 4 drops into the right ear 2 (two) times daily. 10/16/19   Particia Nearing, PA-C  fluticasone [facility]) 50 MCG/ACT nasal spray Place 1 spray into both nostrils daily. 10/14/19   Darr, Veryl Speak, PA-C  ibuprofen (ADVIL) 800 MG tablet Take 1 tablet (800 mg total) by mouth 3 (three) times daily. 10/16/19   Particia Nearing, PA-C  lidocaine (XYLOCAINE) 2 % solution Use as directed 20 mLs in the mouth or throat as needed for mouth pain. 10/16/19   Particia Nearing, PA-C  Menthol (CEPACOL  SORE THROAT) 5.4 MG LOZG Use as directed 1 lozenge (5.4 mg total) in the mouth or throat every 2 (two) hours as needed. 10/14/19   Darr, Veryl Speak, PA-C  ondansetron (ZOFRAN ODT) 8 MG disintegrating tablet Take 1 tablet (8 mg total) by mouth every 8 (eight) hours as needed for nausea or vomiting. 10/16/19   Particia Nearing, PA-C  predniSONE (DELTASONE) 10 MG tablet Begin with 6 tabs on day 1, 5 tab on day 2, 4 tab on day 3, 3 tab on day 4, 2 tab on day 5, 1 tab on day 6-take with food 08/13/19   Wieters, [employer] C, PA-C  promethazine-dextromethorphan (PROMETHAZINE-DM) 6.25-15 MG/5ML syrup Take 5 mLs by mouth 4 (four) times daily as needed for cough. 10/16/19   Particia Nearing, PA-C  tiZANidine (ZANAFLEX) 4 MG tablet Take 1 tablet (4 mg total) by mouth every 6 (six) hours as needed for muscle spasms. 08/13/19   Wieters, Junius Creamer, PA-C    Family History Family History  Problem Relation Age of Onset  . Hypertension Mother   . Heart disease Mother   . Kidney disease Mother   . Diabetes Father   . Hyperlipidemia Father   . Hypertension Father   . Heart disease Father   . Kidney disease Sister   . Cancer Paternal Uncle   . Heart disease Maternal Grandfather   . Cancer Paternal Grandfather  Social History Social History   Tobacco Use  . Smoking status: Never Smoker  . Smokeless tobacco: Never Used  Substance Use Topics  . Alcohol use: No  . Drug use: No     Allergies   Patient has no known allergies.   Review of Systems Review of Systems PER HPI    Physical Exam Triage Vital Signs ED Triage Vitals  Enc Vitals Group     BP 10/16/19 0837 110/75     Pulse Rate 10/16/19 0837 95     Resp 10/16/19 0837 20     Temp 10/16/19 0837 99.8 F (37.7 C)     Temp Source 10/16/19 0837 Oral     SpO2 10/16/19 0837 95 %     Weight --      Height --      Head Circumference --      Peak Flow --      Pain Score 10/16/19 0833 4     Pain Loc --      Pain Edu? --      Excl. in  GC? --    No data found.  Updated Vital Signs BP 110/75 (BP Location: Left Arm)   Pulse 95   Temp 99.8 F (37.7 C) (Oral)   Resp 20   SpO2 95%   Visual Acuity Right Eye Distance:   Left Eye Distance:   Bilateral Distance:    Right Eye Near:   Left Eye Near:    Bilateral Near:     Physical Exam Vitals and nursing note reviewed.  Constitutional:      Appearance: Normal appearance.  HENT:     Head: Atraumatic.     Right Ear: Tympanic membrane normal.     Left Ear: Tympanic membrane and ear canal normal.     Ears:     Comments: Right EAC erythematous and ttp    Nose: Nose normal.     Mouth/Throat:     Mouth: Mucous membranes are moist.     Pharynx: Posterior oropharyngeal erythema present.  Eyes:     Extraocular Movements: Extraocular movements intact.     Conjunctiva/sclera: Conjunctivae normal.  Cardiovascular:     Rate and Rhythm: Normal rate and regular rhythm.  Pulmonary:     Effort: Pulmonary effort is normal.     Breath sounds: Normal breath sounds.  Abdominal:     General: Bowel sounds are normal. There is no distension.     Palpations: Abdomen is soft.     Tenderness: There is no abdominal tenderness. There is no guarding.  Musculoskeletal:        General: Normal range of motion.     Cervical back: Normal range of motion and neck supple.  Skin:    General: Skin is warm and dry.  Neurological:     General: No focal deficit present.     Mental Status: He is oriented to person, place, and time.  Psychiatric:        Mood and Affect: Mood normal.        Thought Content: Thought content normal.        Judgment: Judgment normal.    UC Treatments / Results  Labs (all labs ordered are listed, but only abnormal results are displayed) Labs Reviewed - No data to display  EKG   Radiology No results found.  Procedures Procedures (including critical care time)  Medications Ordered in UC Medications - No data to display  Initial Impression /  Assessment and Plan /  UC Course  I have reviewed the triage vital signs and the nursing notes.  Pertinent labs & imaging results that were available during my care of the patient were reviewed by me and considered in my medical decision making (see chart for details).     Recently diagnosed COVID 19 infection still symptomatic - will add multiple medications for symptomatic relief at his request - phenergan DM cough syrup, viscous lidocaine for sore throat, ibuprofen, ciprodex drops for possible otitis externa, and zofran for prn nausea. Return if sxs worsening and not resolving.   Final Clinical Impressions(s) / UC Diagnoses   Final diagnoses:  COVID-19  Sore throat  Right ear pain   Discharge Instructions   None    ED Prescriptions    Medication Sig Dispense Auth. Provider   promethazine-dextromethorphan (PROMETHAZINE-DM) 6.25-15 MG/5ML syrup Take 5 mLs by mouth 4 (four) times daily as needed for cough. 100 mL Particia Nearing, [place]   ibuprofen (ADVIL) 800 MG tablet Take 1 tablet (800 mg total) by mouth 3 (three) times daily. 21 tablet Particia Nearing, [place]   lidocaine (XYLOCAINE) 2 % solution Use as directed 20 mLs in the mouth or throat as needed for mouth pain. 100 mL Particia Nearing, PA-C   ciprofloxacin-dexamethasone [facility]) OTIC suspension Place 4 drops into the right ear 2 (two) times daily. 7.5 mL Particia Nearing, PA-C   ondansetron (ZOFRAN ODT) 8 MG disintegrating tablet Take 1 tablet (8 mg total) by mouth every 8 (eight) hours as needed for nausea or vomiting. 21 tablet Particia Nearing, [place]     PDMP not reviewed this encounter.   Particia Nearing, [place] 10/16/19 1426

## 2019-10-16 NOTE — ED Triage Notes (Signed)
Pt is covid positive as of receiving results yesterday: pt states he has had swimmers ear all summer with most pain in his right ear, sore throat and non productive cough.

## 2019-10-16 NOTE — Telephone Encounter (Signed)
Called to Discuss with patient about Covid symptoms and the use of the monoclonal antibody infusion for those with mild to moderate Covid symptoms and at a high risk of hospitalization.     Pt appears to qualify for this infusion due to co-morbid conditions and/or a member of an at-risk group in accordance with the FDA Emergency Use Authorization.    Unable to reach pt; LVM with hotline and my work cell phone - looks like he is currently in Urgent Care.   If provider assessing him could please discuss MAB and refer him back to our hotline [phone] he seems to be a good candidate to receive treatment.    Rexene Alberts, MSN, NP-C [facility]  [email or web address] Pager: [phone] Office: [phone] [facility] Main Line: [phone]

## 2019-10-17 ENCOUNTER — Other Ambulatory Visit: Payer: Self-pay

## 2019-10-18 ENCOUNTER — Other Ambulatory Visit: Payer: Self-pay

## 2019-10-18 ENCOUNTER — Emergency Department (HOSPITAL_COMMUNITY)
Admission: EM | Admit: 2019-10-18 | Discharge: 2019-10-18 | Disposition: A | Discharge status: Home / Self Care | Payer: Medicaid Other | Attending: Emergency Medicine | Admitting: Emergency Medicine

## 2019-10-18 ENCOUNTER — Other Ambulatory Visit: Payer: Self-pay | Admitting: Family Medicine

## 2019-10-18 ENCOUNTER — Encounter (HOSPITAL_COMMUNITY): Payer: Self-pay | Admitting: Emergency Medicine

## 2019-10-18 DIAGNOSIS — Z5321 Procedure and treatment not carried out due to patient leaving prior to being seen by health care provider: Secondary | ICD-10-CM | POA: Insufficient documentation

## 2019-10-18 DIAGNOSIS — J029 Acute pharyngitis, unspecified: Secondary | ICD-10-CM | POA: Diagnosis present

## 2019-10-18 DIAGNOSIS — U071 COVID-19: Secondary | ICD-10-CM | POA: Diagnosis not present

## 2019-10-18 NOTE — ED Notes (Signed)
[phone] Flatten- Wife would like an update

## 2019-10-18 NOTE — ED Triage Notes (Signed)
Pt states he was covid + 2 weeks ago and he has increase sore throat.

## 2019-10-18 NOTE — ED Notes (Signed)
Cuqu, wife, [phone] would like an update when pt is in the room

## 2019-10-18 NOTE — ED Notes (Signed)
Pt did not want to stay. 

## 2019-10-19 ENCOUNTER — Other Ambulatory Visit: Payer: Self-pay

## 2019-10-19 ENCOUNTER — Encounter (HOSPITAL_COMMUNITY): Payer: Self-pay

## 2019-10-19 ENCOUNTER — Ambulatory Visit (HOSPITAL_COMMUNITY)
Admission: EM | Admit: 2019-10-19 | Discharge: 2019-10-19 | Disposition: A | Discharge status: Home / Self Care | Payer: Medicaid Other | Attending: Emergency Medicine | Admitting: Emergency Medicine

## 2019-10-19 DIAGNOSIS — U071 COVID-19: Secondary | ICD-10-CM | POA: Insufficient documentation

## 2019-10-19 DIAGNOSIS — J029 Acute pharyngitis, unspecified: Secondary | ICD-10-CM | POA: Diagnosis not present

## 2019-10-19 LAB — POCT RAPID STREP A, ED / UC: Streptococcus, Group A Screen (Direct): NEGATIVE

## 2019-10-19 MED ORDER — LIDOCAINE VISCOUS HCL 2 % MT SOLN: 15.0000 mL | Freq: Four times a day (QID) | OROMUCOSAL | 0 refills | Status: AC | PRN

## 2019-10-19 MED FILL — LIDOCAINE 2% VISCOUS SOLN: 2 | 2 days supply | Qty: 100 | Fill #0

## 2019-10-19 NOTE — ED Triage Notes (Signed)
Pt presents with sore throat x 1 week. States pain is worse when swallow. Pt tested positive for COVID 5 days ago.

## 2019-10-19 NOTE — ED Provider Notes (Signed)
[facility]    CSN: [id number] Arrival date & time: 10/19/19  1002      History   Chief Complaint Chief Complaint  Patient presents with   Sore Throat    + COVID    HPI Drew Roberson is a 44 y.o. male.   Patient presents with a sore throat x5 days.  He denies fever, shortness of breath, diarrhea, or other symptoms.  Treatment attempted at home with topical lidocaine and lidocaine which were both prescribed at his previous visit.  He states he is out of lidocaine and needs a refill.  COVID positive on 10/14/2019.     The history is provided by the patient.    Past Medical History:  Diagnosis Date   Hyperlipidemia    Mitral valve prolapse     Patient Active Problem List   Diagnosis Date Noted   Screening for diabetes mellitus 08/14/2019   Screening for hypercholesterolemia 08/14/2019   At risk for hepatitis 08/14/2019    Past Surgical History:  Procedure Laterality Date   ANKLE FRACTURE SURGERY Left 2009       Home Medications    Prior to Admission medications   Medication Sig Start Date End Date Taking? Authorizing Provider  acetaminophen (TYLENOL) 325 MG tablet Take 2 tablets (650 mg total) by mouth every 6 (six) hours as needed. 10/14/19   Darr, Veryl Speak, PA-C  benzonatate (TESSALON) 100 MG capsule Take 1-2 capsules (100-200 mg total) by mouth every 8 (eight) hours. 10/14/19   Darr, Veryl Speak, PA-C  ciprofloxacin-dexamethasone (CIPRODEX) OTIC suspension Place 4 drops into the right ear 2 (two) times daily. 10/16/19   Particia Nearing, PA-C  fluticasone [facility]) 50 MCG/ACT nasal spray Place 1 spray into both nostrils daily. 10/14/19   Darr, Veryl Speak, PA-C  ibuprofen (ADVIL) 800 MG tablet Take 1 tablet (800 mg total) by mouth 3 (three) times daily. 10/16/19   Particia Nearing, PA-C  lidocaine (XYLOCAINE) 2 % solution Use as directed 15 mLs in the mouth or throat every 6 (six) hours as needed for up to 5 days for mouth pain. 10/19/19 10/24/19   Mickie Bail, NP  Menthol (CEPACOL SORE THROAT) 5.4 MG LOZG Use as directed 1 lozenge (5.4 mg total) in the mouth or throat every 2 (two) hours as needed. 10/14/19   Darr, Veryl Speak, PA-C  ondansetron (ZOFRAN ODT) 8 MG disintegrating tablet Take 1 tablet (8 mg total) by mouth every 8 (eight) hours as needed for nausea or vomiting. 10/16/19   Particia Nearing, PA-C  predniSONE (DELTASONE) 10 MG tablet Begin with 6 tabs on day 1, 5 tab on day 2, 4 tab on day 3, 3 tab on day 4, 2 tab on day 5, 1 tab on day 6-take with food 08/13/19   Wieters, [employer], PA-C  promethazine-dextromethorphan (PROMETHAZINE-DM) 6.25-15 MG/5ML syrup Take 5 mLs by mouth 4 (four) times daily as needed for cough. 10/16/19   Particia Nearing, PA-C  tiZANidine (ZANAFLEX) 4 MG tablet Take 1 tablet (4 mg total) by mouth every 6 (six) hours as needed for muscle spasms. 08/13/19   Wieters, Junius Creamer, PA-C    Family History Family History  Problem Relation Age of Onset   Hypertension Mother    Heart disease Mother    Kidney disease Mother    Diabetes Father    Hyperlipidemia Father    Hypertension Father    Heart disease Father    Kidney disease Sister  Cancer Paternal Uncle    Heart disease Maternal Grandfather    Cancer Paternal Grandfather     Social History Social History   Tobacco Use   Smoking status: Never Smoker   Smokeless tobacco: Never Used  Substance Use Topics   Alcohol use: No   Drug use: No     Allergies   Patient has no known allergies.   Review of Systems Review of Systems  Constitutional: Negative for chills and fever.  HENT: Positive for sore throat. Negative for ear pain.   Eyes: Negative for pain and visual disturbance.  Respiratory: Negative for cough and shortness of breath.   Cardiovascular: Negative for chest pain and palpitations.  Gastrointestinal: Negative for abdominal pain and vomiting.  Genitourinary: Negative for dysuria and hematuria.    Musculoskeletal: Negative for arthralgias and back pain.  Skin: Negative for color change and rash.  Neurological: Negative for seizures and syncope.  All other systems reviewed and are negative.    Physical Exam Triage Vital Signs ED Triage Vitals  Enc Vitals Group     BP      Pulse      Resp      Temp      Temp src      SpO2      Weight      Height      Head Circumference      Peak Flow      Pain Score      Pain Loc      Pain Edu?      Excl. in GC?    No data found.  Updated Vital Signs BP 124/89 (BP Location: Left Arm)    Pulse 98    Temp 98.6 F (37 C) (Oral)    Resp 20    SpO2 96%   Visual Acuity Right Eye Distance:   Left Eye Distance:   Bilateral Distance:    Right Eye Near:   Left Eye Near:    Bilateral Near:     Physical Exam Vitals and nursing note reviewed.  Constitutional:      General: He is not in acute distress.    Appearance: He is well-developed. He is not ill-appearing.  HENT:     Head: Normocephalic and atraumatic.     Right Ear: Tympanic membrane normal.     Left Ear: Tympanic membrane normal.     Nose: Nose normal.     Mouth/Throat:     Mouth: Mucous membranes are moist.     Pharynx: Oropharynx is clear.     Comments: Speech clear.  No oropharyngeal swelling.  No difficulty swallowing. Eyes:     Conjunctiva/sclera: Conjunctivae normal.  Cardiovascular:     Rate and Rhythm: Normal rate and regular rhythm.     Heart sounds: No murmur heard.   Pulmonary:     Effort: Pulmonary effort is normal. No respiratory distress.     Breath sounds: Normal breath sounds.  Abdominal:     Palpations: Abdomen is soft.     Tenderness: There is no abdominal tenderness. There is no guarding or rebound.  Musculoskeletal:     Cervical back: Neck supple.  Skin:    General: Skin is warm and dry.     Findings: No rash.  Neurological:     General: No focal deficit present.     Mental Status: He is alert and oriented to person, place, and time.      Gait: Gait normal.  Psychiatric:  Mood and Affect: Mood normal.        Behavior: Behavior normal.      UC Treatments / Results  Labs (all labs ordered are listed, but only abnormal results are displayed) Labs Reviewed  POCT RAPID STREP A, ED / UC    EKG   Radiology No results found.  Procedures Procedures (including critical care time)  Medications Ordered in UC Medications - No data to display  Initial Impression / Assessment and Plan / UC Course  I have reviewed the triage vital signs and the nursing notes.  Pertinent labs & imaging results that were available during my care of the patient were reviewed by me and considered in my medical decision making (see chart for details).   Sore throat, COVID-19.  Rapid strep negative; culture pending.  COVID positive on 10/14/2019.  Lidocaine refilled.  Instructed patient to continue ibuprofen as needed for discomfort.  Instructed patient to continue his quarantine as directed by the CDC.  Patient agrees to plan of care.   Final Clinical Impressions(s) / UC Diagnoses   Final diagnoses:  Sore throat  COVID-19     Discharge Instructions     Your rapid strep test is negative.  A throat culture is pending; we will call you if it is positive requiring treatment.    You should self-quarantine for:  *10 days since your symptoms first appeared and  *24 hours with no fever, without the use of fever-reducing medications and  *your other symptoms of COVID are improving.  Most people do not need to be re-tested at the end of the quarantine period.    Go to the emergency department if you have high fever not relieved by Tylenol, shortness of breath, severe diarrhea, or other concerning symptoms.         ED Prescriptions    Medication Sig Dispense Auth. Provider   lidocaine (XYLOCAINE) 2 % solution Use as directed 15 mLs in the mouth or throat every 6 (six) hours as needed for up to 5 days for mouth pain. 100 mL Mickie Bail, NP     PDMP not reviewed this encounter.   Mickie Bail, NP 10/19/19 1056

## 2019-10-19 NOTE — Discharge Instructions (Signed)
Your rapid strep test is negative.  A throat culture is pending; we will call you if it is positive requiring treatment.    You should self-quarantine for:  *10 days since your symptoms first appeared and  *24 hours with no fever, without the use of fever-reducing medications and  *your other symptoms of COVID are improving.  Most people do not need to be re-tested at the end of the quarantine period.    Go to the emergency department if you have high fever not relieved by Tylenol, shortness of breath, severe diarrhea, or other concerning symptoms.

## 2019-10-21 ENCOUNTER — Telehealth: Payer: Self-pay | Admitting: *Deleted

## 2019-10-21 LAB — CULTURE, GROUP A STREP (THRC)

## 2019-10-21 MED ORDER — PROMETHAZINE-DM 6.25-15 MG/5ML PO SYRP
5.0000 mL | ORAL_SOLUTION | Freq: Four times a day (QID) | ORAL | 0 refills | Status: DC | PRN
Start: 2019-10-21 — End: 2020-04-30

## 2019-10-21 MED FILL — PROMETHAZINE W/DM SYRUP: 6.25-15 | 5 days supply | Qty: 100 | Fill #0

## 2019-10-21 NOTE — Telephone Encounter (Signed)
error 

## 2019-10-24 ENCOUNTER — Telehealth: Payer: Self-pay | Admitting: *Deleted

## 2019-10-24 MED FILL — IBUPROFEN 800 MG TAB: 800 | 4 days supply | Qty: 16 | Fill #0

## 2019-10-24 MED FILL — PENICILLIN VK 500 MG TABLET: 500 | 7 days supply | Qty: 28 | Fill #0

## 2019-10-24 NOTE — Telephone Encounter (Signed)
Email sent to Family Medicine  to schedule follow up appointment .   Nadia Torr    PEC [phone] 

## 2019-11-09 DIAGNOSIS — R0602 Shortness of breath: Secondary | ICD-10-CM | POA: Diagnosis not present

## 2019-11-09 DIAGNOSIS — I4510 Unspecified right bundle-branch block: Secondary | ICD-10-CM | POA: Diagnosis not present

## 2019-11-09 DIAGNOSIS — J9811 Atelectasis: Secondary | ICD-10-CM | POA: Diagnosis not present

## 2019-11-09 DIAGNOSIS — R0789 Other chest pain: Secondary | ICD-10-CM | POA: Diagnosis not present

## 2019-12-03 ENCOUNTER — Ambulatory Visit: Payer: Medicaid Other

## 2019-12-20 ENCOUNTER — Ambulatory Visit: Payer: Medicaid Other

## 2019-12-24 ENCOUNTER — Ambulatory Visit: Payer: Medicaid Other | Attending: Internal Medicine

## 2019-12-24 ENCOUNTER — Other Ambulatory Visit (HOSPITAL_BASED_OUTPATIENT_CLINIC_OR_DEPARTMENT_OTHER): Payer: Self-pay | Admitting: Internal Medicine

## 2019-12-24 DIAGNOSIS — Z23 Encounter for immunization: Secondary | ICD-10-CM

## 2019-12-24 MED FILL — PFIZER-BIONTECH COVID-19 VA: 30 | 1 days supply | Qty: 0 | Fill #0

## 2019-12-24 NOTE — Progress Notes (Signed)
   Covid-19 Vaccination Clinic  Name:  Drew Roberson    MRN: [id number] DOB: 26-Sep-1975  12/24/2019  [name] was observed post Covid-19 immunization for 15 minutes without incident. He was provided with Vaccine Information Sheet and instruction to access the V-Safe system.   [name] was instructed to call 911 with any severe reactions post vaccine: Marland Kitchen Difficulty breathing  . Swelling of face and throat  . A fast heartbeat  . A bad rash all over body  . Dizziness and weakness   Immunizations Administered    Name Date Dose VIS Date Route   Pfizer COVID-19 Vaccine 12/24/2019  9:01 AM 0.3 mL 11/13/2019 Intramuscular   Manufacturer: [employer], [employer]   Lot: [id number]   NDC: [address]-8

## 2020-01-14 ENCOUNTER — Ambulatory Visit: Payer: Medicaid Other

## 2020-01-14 ENCOUNTER — Other Ambulatory Visit (HOSPITAL_BASED_OUTPATIENT_CLINIC_OR_DEPARTMENT_OTHER): Payer: Self-pay | Admitting: Internal Medicine

## 2020-01-14 ENCOUNTER — Ambulatory Visit: Payer: Medicaid Other | Attending: Internal Medicine

## 2020-01-14 DIAGNOSIS — Z23 Encounter for immunization: Secondary | ICD-10-CM

## 2020-01-14 NOTE — Progress Notes (Signed)
   Covid-19 Vaccination Clinic  Name:  Drew Roberson    MRN: [id number] DOB: 12-01-1975  01/14/2020  [name] was observed post Covid-19 immunization for 15 minutes without incident. He was provided with Vaccine Information Sheet and instruction to access the V-Safe system.   Mr. Punch was instructed to call 911 with any severe reactions post vaccine: Marland Kitchen Difficulty breathing  . Swelling of face and throat  . A fast heartbeat  . A bad rash all over body  . Dizziness and weakness   Immunizations Administered    Name Date Dose VIS Date Route   Pfizer COVID-19 Vaccine 01/14/2020  9:26 AM 0.3 mL 11/13/2019 Intramuscular   Manufacturer: [employer], [employer]   Lot: [id number]   NDC: [id number]

## 2020-01-15 MED FILL — PFIZER-BIONTECH COVID-19 VA: 30 | 1 days supply | Qty: 0 | Fill #0

## 2020-01-20 ENCOUNTER — Ambulatory Visit: Payer: Medicaid Other

## 2020-03-10 DIAGNOSIS — H35712 Central serous chorioretinopathy, left eye: Secondary | ICD-10-CM | POA: Diagnosis not present

## 2020-03-10 DIAGNOSIS — H43823 Vitreomacular adhesion, bilateral: Secondary | ICD-10-CM | POA: Diagnosis not present

## 2020-03-10 DIAGNOSIS — H35033 Hypertensive retinopathy, bilateral: Secondary | ICD-10-CM | POA: Diagnosis not present

## 2020-03-19 ENCOUNTER — Other Ambulatory Visit (HOSPITAL_COMMUNITY): Payer: Self-pay

## 2020-03-19 MED FILL — IBUPROFEN 800 MG TAB: 800 | 4 days supply | Qty: 16 | Fill #0

## 2020-03-19 MED FILL — PENICILLIN VK 500 MG TABLET: 500 | 7 days supply | Qty: 28 | Fill #0

## 2020-04-28 DIAGNOSIS — M25572 Pain in left ankle and joints of left foot: Secondary | ICD-10-CM | POA: Diagnosis not present

## 2020-04-28 DIAGNOSIS — M25562 Pain in left knee: Secondary | ICD-10-CM | POA: Diagnosis not present

## 2020-04-28 DIAGNOSIS — T8484XA Pain due to internal orthopedic prosthetic devices, implants and grafts, initial encounter: Secondary | ICD-10-CM | POA: Diagnosis not present

## 2020-04-30 ENCOUNTER — Ambulatory Visit (INDEPENDENT_AMBULATORY_CARE_PROVIDER_SITE_OTHER): Payer: Medicaid Other | Admitting: Family Medicine

## 2020-04-30 ENCOUNTER — Other Ambulatory Visit: Payer: Self-pay

## 2020-04-30 ENCOUNTER — Encounter: Payer: Self-pay | Admitting: Family Medicine

## 2020-04-30 VITALS — BP 125/60 | HR 78 | Ht 67.0 in | Wt 170.2 lb

## 2020-04-30 DIAGNOSIS — Z13220 Encounter for screening for lipoid disorders: Secondary | ICD-10-CM | POA: Diagnosis not present

## 2020-04-30 NOTE — Patient Instructions (Signed)
It was great to see you today.  Here is a quick review of the things we talked about:  Cholesterol: We do not have to test for cholesterol every year.  I think it is okay for [place] to check again this year to make sure that you are not at risk for heart problems or strokes.  I will let you know if there is any issue.  Chest ache: This is not a heart problem or lung problem.  I suspect this is likely a muscular problem.  I recommend that you take ibuprofen or Motrin if it bothers you.  I would like you to come back to clinic if you think that this discomfort is getting significantly worse or if it causes shortness of breath or if you notice that happens with exercise.

## 2020-04-30 NOTE — Progress Notes (Addendum)
    SUBJECTIVE:   CHIEF COMPLAINT / HPI:   Mr. Dorsey presents to clinic today for an annual physical.  Mood: He scored an 8 on the PHQ-9 today.  He reports that he does sometimes experience mild symptoms of depression.  He denies SI.  He is not interested in any further work-up, therapy or discussion regarding his mood today.  Sexually active: Sexually active with 1 partner in the past year.  No concerns at this time.  Smoking: Denies smoking history  Drinking alcohol: Denies alcohol history  Illicit drug use: Denies recreational drug use  Chest ache He reports that he experiences occasional chest aching in the left side of his chest since he was diagnosed with Covid earlier this year.  He experiences a mild aching sensation around the level of his fifth rib roughly 2 times per week.  This sensation lasts for about an hour.  There is no radiation to his arm, back, jaw.  He notices this when sitting down.  He is not associated this discomfort with activity.  He reports that he can go to the gym in the sauna and does not notice this at all.  He associates this discomfort with the cold.  He notes that when he warms up the aching seems to improve.  He specifically denies palpitations, shortness of breath.  PERTINENT  PMH / PSH: His wife has chronic hepatitis B.  OBJECTIVE:   BP 125/60   Pulse 78   Ht 5\' 7"  (1.702 m)   Wt 170 lb 3.2 oz (77.2 kg)   SpO2 98%   BMI 26.66 kg/m    General: Alert and cooperative and appears to be in no acute distress Cardio: Normal S1 and S2, no S3 or S4. Rhythm is regular. No murmurs or rubs.  No tenderness with palpation of the chest wall. Pulm: Clear to auscultation bilaterally, no crackles, wheezing, or diminished breath sounds. Normal respiratory effort Abdomen: Bowel sounds normal. Abdomen soft and non-tender.  Extremities: No peripheral edema. Warm/ well perfused.  Strong radial pulses. Neuro: Cranial nerves grossly intact   ASSESSMENT/PLAN:     Annual Examination   I reviewed the following patient responses on our Physical Exam Form Tobacco use  Alcohol Use  Weight  Exercise  Risk for STI  Increased family cancer risk Violence risk  PHQ9 score reviewed-not interested in therapy or medication at this time. Blood pressure reviewed  I considered the following items based upon USPSTF recommendations: HIV testing:  Hepatitis C testing Cholesterol screening STI screening if high risk (Hepatitis B, Syphilis, Gonorrhea, Chlamydia) Immunizations - Influenza, Covid, [place], Pneumonia, Tetanus  See After Visit Summary for recommendations    , MD [facility] : [facility]

## 2020-05-01 LAB — LIPID PANEL
Chol/HDL Ratio: 6.5 ratio — ABNORMAL HIGH (ref 0.0–5.0)
Cholesterol, Total: 216 mg/dL — ABNORMAL HIGH (ref 100–199)
HDL: 33 mg/dL — ABNORMAL LOW (ref >39)
LDL Chol Calc (NIH): 156 mg/dL — ABNORMAL HIGH (ref 0–99)
Triglycerides: 146 mg/dL (ref 0–149)
VLDL Cholesterol Cal: 27 mg/dL (ref 5–40)

## 2020-05-15 DIAGNOSIS — M25562 Pain in left knee: Secondary | ICD-10-CM | POA: Diagnosis not present

## 2020-06-03 ENCOUNTER — Other Ambulatory Visit: Payer: Self-pay

## 2020-06-03 ENCOUNTER — Ambulatory Visit: Payer: Medicaid Other | Admitting: Podiatry

## 2020-06-03 ENCOUNTER — Encounter: Payer: Self-pay | Admitting: Podiatry

## 2020-06-03 ENCOUNTER — Ambulatory Visit (INDEPENDENT_AMBULATORY_CARE_PROVIDER_SITE_OTHER): Payer: Medicaid Other

## 2020-06-03 DIAGNOSIS — M216X2 Other acquired deformities of left foot: Secondary | ICD-10-CM

## 2020-06-03 DIAGNOSIS — M25572 Pain in left ankle and joints of left foot: Secondary | ICD-10-CM
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
## 2020-06-23 ENCOUNTER — Other Ambulatory Visit: Payer: Self-pay

## 2020-06-23 ENCOUNTER — Encounter: Payer: Self-pay | Admitting: Podiatry

## 2020-06-23 ENCOUNTER — Ambulatory Visit: Payer: Medicaid Other | Admitting: Podiatry

## 2020-06-23 DIAGNOSIS — T8484XA Pain due to internal orthopedic prosthetic devices, implants and grafts, initial encounter: Secondary | ICD-10-CM | POA: Diagnosis not present

## 2020-06-23 DIAGNOSIS — H35712 Central serous chorioretinopathy, left eye: Secondary | ICD-10-CM | POA: Diagnosis not present

## 2020-06-23 DIAGNOSIS — M25572 Pain in left ankle and joints of left foot: Secondary | ICD-10-CM
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
## 2020-07-16 ENCOUNTER — Telehealth: Payer: Self-pay

## 2020-07-16 NOTE — Telephone Encounter (Signed)
Drew Roberson called to cancel his surgery with Dr. Lilian Kapur on 7/29/222. He stated he is moving and needs to cancel. Notified Dr. Lilian Kapur and Aram Beecham with [facility].

## 2020-08-27 ENCOUNTER — Encounter: Payer: Medicaid Other | Admitting: Podiatry

## 2020-09-10 ENCOUNTER — Encounter: Payer: Medicaid Other | Admitting: Podiatry

## 2020-10-01 ENCOUNTER — Encounter: Payer: Medicaid Other | Admitting: Podiatry

## 2020-10-29 ENCOUNTER — Ambulatory Visit: Payer: Medicaid Other | Admitting: Podiatry

## 2020-11-03 DIAGNOSIS — E785 Hyperlipidemia, unspecified: Secondary | ICD-10-CM | POA: Insufficient documentation
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
## 2020-11-06 ENCOUNTER — Ambulatory Visit (HOSPITAL_COMMUNITY)
Admission: RE | Admit: 2020-11-06 | Discharge: 2020-11-06 | Disposition: A | Discharge status: Home / Self Care | Payer: Medicaid Other | Source: Ambulatory Visit | Attending: Family Medicine | Admitting: Family Medicine

## 2020-11-06 ENCOUNTER — Ambulatory Visit: Payer: Medicaid Other | Admitting: Family Medicine

## 2020-11-06 ENCOUNTER — Other Ambulatory Visit: Payer: Self-pay

## 2020-11-06 VITALS — BP 110/70 | HR 78 | Ht 67.0 in | Wt 169.4 lb

## 2020-11-06 DIAGNOSIS — R079 Chest pain, unspecified: Secondary | ICD-10-CM

## 2020-11-06 DIAGNOSIS — Z131 Encounter for screening for diabetes mellitus: Secondary | ICD-10-CM | POA: Diagnosis not present

## 2020-11-06 DIAGNOSIS — E785 Hyperlipidemia, unspecified: Secondary | ICD-10-CM

## 2020-11-06 NOTE — Patient Instructions (Addendum)
We are going check some blood work today including your LDL and your A1c.  For the lesion on the side of your leg I would like for you to keep an eye on this and make sure is not changing.  Is most likely a lipoma which is a benign fatty tumor and we do not need to do anything about it unless it starts getting bigger or causing pain.  For your chest pain we performed an EKG which was normal.  If you develop any worsening chest pain, chest pain with nausea, shortness of breath, or chest pain that does not resolve please return or go to the emergency department.  For the concern for glass in your scalp because there is no sign of infection, no sign of abscess it would be hard to determine if there is anything there.  An x-ray would not show it.  You may be having pain in the area due to nerve damage that occurred when you got the injury.  I would continue to monitor it and if the pain gets to be worse please let us know.

## 2020-11-07 ENCOUNTER — Ambulatory Visit (HOSPITAL_COMMUNITY): Admission: RE | Admit: 2020-11-07 | Payer: Medicaid Other | Source: Ambulatory Visit

## 2020-11-07 LAB — HEMOGLOBIN A1C
Est. average glucose Bld gHb Est-mCnc: 91 mg/dL
Hgb A1c MFr Bld: 4.8 % (ref 4.8–5.6)

## 2020-11-07 LAB — LDL CHOLESTEROL, DIRECT: LDL Direct: 147 mg/dL — ABNORMAL HIGH (ref 0–99)

## 2021-05-10 ENCOUNTER — Other Ambulatory Visit (HOSPITAL_COMMUNITY): Payer: Self-pay

## 2021-05-10 MED ORDER — IBUPROFEN 800 MG PO TABS
ORAL_TABLET | ORAL | 0 refills | Status: DC
  Filled 2021-05-10: qty 20, 5d supply, fill #0

## 2021-05-10 MED ORDER — PENICILLIN V POTASSIUM 500 MG PO TABS
ORAL_TABLET | ORAL | 0 refills | Status: AC
  Filled 2021-05-10: qty 28, 7d supply, fill #0

## 2021-05-19 ENCOUNTER — Other Ambulatory Visit (HOSPITAL_COMMUNITY): Payer: Self-pay

## 2021-06-29 ENCOUNTER — Encounter: Payer: Self-pay | Admitting: *Deleted

## 2021-07-30 ENCOUNTER — Encounter: Payer: Self-pay | Admitting: Family Medicine

## 2021-07-30 ENCOUNTER — Ambulatory Visit (INDEPENDENT_AMBULATORY_CARE_PROVIDER_SITE_OTHER): Payer: Medicaid Other | Admitting: Family Medicine

## 2021-07-30 VITALS — BP 106/79 | HR 80 | Ht 67.0 in | Wt 169.7 lb

## 2021-07-30 DIAGNOSIS — E785 Hyperlipidemia, unspecified: Secondary | ICD-10-CM | POA: Diagnosis not present

## 2021-07-30 DIAGNOSIS — Z125 Encounter for screening for malignant neoplasm of prostate: Secondary | ICD-10-CM

## 2021-07-30 DIAGNOSIS — Z13220 Encounter for screening for lipoid disorders: Secondary | ICD-10-CM

## 2021-07-30 NOTE — Progress Notes (Signed)
    SUBJECTIVE:   Chief compliant/HPI: annual examination  Drew Roberson is a 46 y.o. who presents today for an annual exam.   Patient reports his father had prostate cancer at age 3 and would like PSA to be checked today. He is originally from [place] and they are returning there at the end of the month. They are leaving as he feels that he cannot get ahead or live a good life in the [place] He was trained with a bachelor's of engineering in [place], but cannot use that degree here. He tried [employer], but found he was undergoing constant racist and xenophobic harassment by customers. 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 in [place]. He reports that he does not want medication or referral for therapy, he believes his symptoms will improve by returning to his home country.    07/30/2021    2:32 PM 11/06/2020    2:30 PM 04/30/2020   10:46 AM  PHQ9 SCORE ONLY  PHQ-9 Total Score 19 12 8      History tabs reviewed and updated.   Review of systems form reviewed and notable for elevated PHQ-9.   OBJECTIVE:   BP 106/79   Pulse 80   Ht 5\' 7"  (1.702 m)   Wt 169 lb 11.2 oz (77 kg)   SpO2 97%   BMI 26.58 kg/m   Nursing note and vitals reviewed GEN: age-appropriate, manle, resting comfortably in chair, NAD, WNWD HEENT: NCAT. PERRLA. Sclera without injection or icterus. MMM.  Cardiac: Regular rate and rhythm. Normal S1/S2. No murmurs, rubs, or gallops appreciated. 2+ radial pulses. Lungs: Clear bilaterally to ascultation. No increased WOB, no accessory muscle usage. No w/r/r. Neuro: AOx3  Ext: no edema Psych: Pleasant and appropriate  ASSESSMENT/PLAN:   Screening for hypercholesterolemia Will check CMP, lipid panel today.  Screening PSA (prostate specific antigen) Patient interested in screening, father had prostate cancer at age 29. PSA WNL.    Annual Examination  See AVS for recommendations.   PHQ score elevated, reviewed.  Blood pressure value is at goal.   Considered the following screening exams based upon USPSTF recommendations: Diabetes screening: discussed and not ordered as it was WNL within the last year- October 2022 Screening for elevated cholesterol: discussed and ordered HIV testing:  previously negative, not at high risk Hepatitis C: discussed and previously negative and not at high risk Hepatitis B: discussed and previously negative Syphilis if at high risk:  not at high risk Reviewed risk factors for latent tuberculosis and not indicated Colorectal cancer screening: not applicable given age.  if age 30 or over or risk factors.  Immunizations UTD.   Follow up in 1 year or sooner if indicated.    November 2022, MD [facility]

## 2021-07-30 NOTE — Patient Instructions (Signed)
It was a pleasure to see you today!  We will get some labs today.  If they are abnormal or we need to do something about them, I will call you.  If they are normal, I will send you a message on MyChart (if it is active) or a letter in the mail.  If you don't hear from [place] in 2 weeks, please call the office  [phone]. I wish you both the best of luck in [place] :)    Be Well,  Dr. Leary Roca

## 2021-07-31 LAB — COMPREHENSIVE METABOLIC PANEL
ALT: 31 IU/L (ref 0–44)
AST: 27 IU/L (ref 0–40)
Albumin/Globulin Ratio: 1.8 (ref 1.2–2.2)
Albumin: 4.6 g/dL (ref 4.0–5.0)
Alkaline Phosphatase: 83 IU/L (ref 44–121)
BUN/Creatinine Ratio: 12 (ref 9–20)
BUN: 11 mg/dL (ref 6–24)
Bilirubin Total: 0.4 mg/dL (ref 0.0–1.2)
CO2: 22 mmol/L (ref 20–29)
Calcium: 9.1 mg/dL (ref 8.7–10.2)
Chloride: 104 mmol/L (ref 96–106)
Creatinine, Ser: 0.9 mg/dL (ref 0.76–1.27)
Globulin, Total: 2.6 g/dL (ref 1.5–4.5)
Glucose: 91 mg/dL (ref 70–99)
Potassium: 4.1 mmol/L (ref 3.5–5.2)
Sodium: 139 mmol/L (ref 134–144)
Total Protein: 7.2 g/dL (ref 6.0–8.5)
eGFR: 108 mL/min/{1.73_m2} (ref >59)

## 2021-07-31 LAB — LIPID PANEL
Chol/HDL Ratio: 6.1 ratio — ABNORMAL HIGH (ref 0.0–5.0)
Cholesterol, Total: 220 mg/dL — ABNORMAL HIGH (ref 100–199)
HDL: 36 mg/dL — ABNORMAL LOW (ref >39)
LDL Chol Calc (NIH): 162 mg/dL — ABNORMAL HIGH (ref 0–99)
Triglycerides: 122 mg/dL (ref 0–149)
VLDL Cholesterol Cal: 22 mg/dL (ref 5–40)

## 2021-07-31 LAB — PSA: Prostate Specific Ag, Serum: 0.4 ng/mL (ref 0.0–4.0)

## 2021-08-02 ENCOUNTER — Encounter: Payer: Self-pay | Admitting: Family Medicine

## 2021-08-02 DIAGNOSIS — Z125 Encounter for screening for malignant neoplasm of prostate: Secondary | ICD-10-CM | POA: Insufficient documentation

## 2021-08-02 NOTE — Assessment & Plan Note (Signed)
Will check CMP, lipid panel today.

## 2021-08-02 NOTE — Assessment & Plan Note (Signed)
Patient interested in screening, father had prostate cancer at age 44. PSA WNL.

## 2021-08-05 ENCOUNTER — Encounter (HOSPITAL_COMMUNITY): Payer: Self-pay | Admitting: Emergency Medicine

## 2021-08-05 ENCOUNTER — Ambulatory Visit (HOSPITAL_COMMUNITY)
Admission: EM | Admit: 2021-08-05 | Discharge: 2021-08-05 | Disposition: A | Discharge status: Home / Self Care | Payer: Medicaid Other | Attending: Internal Medicine | Admitting: Internal Medicine

## 2021-08-05 DIAGNOSIS — S91331A Puncture wound without foreign body, right foot, initial encounter: Secondary | ICD-10-CM | POA: Diagnosis not present

## 2021-08-05 DIAGNOSIS — Z23 Encounter for immunization: Secondary | ICD-10-CM | POA: Diagnosis not present

## 2021-08-05 MED ORDER — IBUPROFEN 600 MG PO TABS: 600.0000 mg | ORAL_TABLET | Freq: Four times a day (QID) | ORAL | 0 refills | Status: AC | PRN

## 2021-08-05 MED ORDER — TETANUS-DIPHTH-ACELL PERTUSSIS 5-2.5-18.5 LF-MCG/0.5 IM SUSY
0.5000 mL | PREFILLED_SYRINGE | Freq: Once | INTRAMUSCULAR | Status: AC
  Administered 2021-08-05: 0.5 mL via INTRAMUSCULAR

## 2021-08-05 MED ORDER — TETANUS-DIPHTH-ACELL PERTUSSIS 5-2.5-18.5 LF-MCG/0.5 IM SUSY
PREFILLED_SYRINGE | INTRAMUSCULAR | Status: AC
  Filled 2021-08-05: qty 0.5

## 2021-08-05 MED ORDER — CIPROFLOXACIN HCL 500 MG PO TABS: 500.0000 mg | ORAL_TABLET | Freq: Two times a day (BID) | ORAL | 0 refills | Status: AC

## 2021-08-05 NOTE — ED Triage Notes (Signed)
Pt reports stepping on a piece of wood about an 1/2 hrs ago. States it came through his shoe causing a puncture wound on the right foot. Bleeding controlled. Last tetanus 9 years ago.

## 2021-08-05 NOTE — ED Provider Notes (Signed)
[facility]    CSN: [id number] Arrival date & time: 08/05/21  1619      History   Chief Complaint Chief Complaint  Patient presents with   Puncture Wound    HPI Drew Roberson is a 46 y.o. male comes to the urgent care with pain in the right foot.  Patient sustained a puncture wound to the right foot today.  He was running around when a piece of wood punctured his slippers and the sole of his feet.  No retained wound.  Patient cleaned the area with antiseptic solution and hand sanitizer.  Bleeding is controlled.  Last tetanus injection is more than 9 years ago.  No significant pain currently.Marland Kitchen   HPI  Past Medical History:  Diagnosis Date   Hyperlipidemia    Mitral valve prolapse     Patient Active Problem List   Diagnosis Date Noted   Screening PSA (prostate specific antigen) 08/02/2021   Hyperlipidemia 11/03/2020   Screening for diabetes mellitus 08/14/2019   Screening for hypercholesterolemia 08/14/2019   At risk for hepatitis 08/14/2019    Past Surgical History:  Procedure Laterality Date   ANKLE FRACTURE SURGERY Left 2009       Home Medications    Prior to Admission medications   Medication Sig Start Date End Date Taking? Authorizing Provider  ciprofloxacin (CIPRO) 500 MG tablet Take 1 tablet (500 mg total) by mouth every 12 (twelve) hours. 08/05/21  Yes Amayrany Cafaro, Britta Mccreedy, MD  ibuprofen (ADVIL) 600 MG tablet Take 1 tablet (600 mg total) by mouth every 6 (six) hours as needed. 08/05/21  Yes Augusto Deckman, Britta Mccreedy, MD  acetaminophen (TYLENOL) 325 MG tablet Take 2 tablets (650 mg total) by mouth every 6 (six) hours as needed. 10/14/19   Darr, Gerilyn Pilgrim, PA-C  penicillin v potassium (VEETID) 500 MG tablet TAKE 1 TABLET 4 TIMES DAILY UNTIL GONE. 05/10/21       Family History Family History  Problem Relation Age of Onset   Hypertension Mother    Heart disease Mother    Kidney disease Mother    Diabetes Father    Hyperlipidemia Father    Hypertension Father     Heart disease Father    Kidney disease Sister    Cancer Paternal Uncle    Heart disease Maternal Grandfather    Cancer Paternal Grandfather     Social History Social History   Tobacco Use   Smoking status: Never   Smokeless tobacco: Never  Substance Use Topics   Alcohol use: No   Drug use: No     Allergies   Patient has no known allergies.   Review of Systems Review of Systems  Musculoskeletal:  Positive for arthralgias. Negative for myalgias.  Skin: Negative.      Physical Exam Triage Vital Signs ED Triage Vitals  Enc Vitals Group     BP 08/05/21 1714 114/78     Pulse Rate 08/05/21 1714 77     Resp 08/05/21 1714 18     Temp 08/05/21 1714 98.5 F (36.9 C)     Temp Source 08/05/21 1714 Oral     SpO2 08/05/21 1714 96 %     Weight --      Height --      Head Circumference --      Peak Flow --      Pain Score 08/05/21 1715 0     Pain Loc --      Pain Edu? --  Excl. in GC? --    No data found.  Updated Vital Signs BP 114/78 (BP Location: Left Arm)   Pulse 77   Temp 98.5 F (36.9 C) (Oral)   Resp 18   SpO2 96%   Visual Acuity Right Eye Distance:   Left Eye Distance:   Bilateral Distance:    Right Eye Near:   Left Eye Near:    Bilateral Near:     Physical Exam Vitals and nursing note reviewed.  Constitutional:      General: He is not in acute distress.    Appearance: He is not ill-appearing.  Cardiovascular:     Rate and Rhythm: Normal rate.     Pulses: Normal pulses.     Heart sounds: Normal heart sounds.  Pulmonary:     Effort: Pulmonary effort is normal.     Breath sounds: Normal breath sounds.  Skin:    Comments: Puncture wound of the right heel.  No bleeding noted.  No tenderness on palpation.  Neurological:     Mental Status: He is alert.      UC Treatments / Results  Labs (all labs ordered are listed, but only abnormal results are displayed) Labs Reviewed - No data to display  EKG   Radiology No results  found.  Procedures Procedures (including critical care time)  Medications Ordered in UC Medications  Tdap (BOOSTRIX) injection 0.5 mL (0.5 mLs Intramuscular Given 08/05/21 1752)    Initial Impression / Assessment and Plan / UC Course  I have reviewed the triage vital signs and the nursing notes.  Pertinent labs & imaging results that were available during my care of the patient were reviewed by me and considered in my medical decision making (see chart for details).     1.  Puncture wound on the plantar aspect of the right foot: Tetanus vaccination update Ibuprofen as needed for pain and/or fever. Ciprofloxacin 500 mg twice daily for 5 days We will daily wound dressing changes. Return precautions given.  Final Clinical Impressions(s) / UC Diagnoses   Final diagnoses:  Puncture wound of plantar aspect of right foot, initial encounter     Discharge Instructions      Please take ibuprofen as needed for pain and/or fever Take antibiotics as prescribed Return to urgent care if symptoms worsen.   ED Prescriptions     Medication Sig Dispense Auth. Provider   ibuprofen (ADVIL) 600 MG tablet Take 1 tablet (600 mg total) by mouth every 6 (six) hours as needed. 30 tablet Frenchie Dangerfield, Britta Mccreedy, MD   ciprofloxacin (CIPRO) 500 MG tablet Take 1 tablet (500 mg total) by mouth every 12 (twelve) hours. 10 tablet Icy Fuhrmann, Britta Mccreedy, MD      PDMP not reviewed this encounter.   Merrilee Jansky, MD 08/05/21 [phone]

## 2021-08-05 NOTE — Discharge Instructions (Addendum)
Please take ibuprofen as needed for pain and/or fever Take antibiotics as prescribed Return to urgent care if symptoms worsen.
